# Patient Record
Sex: Female | Born: 1940 | Race: White | Hispanic: No | Marital: Married | Smoking: Never smoker
Health system: Southern US, Community
[De-identification: ages and names within clinical notes are randomized; demographics above are authoritative.]

## PROBLEM LIST (undated history)

## (undated) DIAGNOSIS — F419 Anxiety disorder, unspecified: Secondary | ICD-10-CM

## (undated) DIAGNOSIS — M1990 Unspecified osteoarthritis, unspecified site: Secondary | ICD-10-CM

## (undated) DIAGNOSIS — R112 Nausea with vomiting, unspecified: Secondary | ICD-10-CM

## (undated) DIAGNOSIS — I10 Essential (primary) hypertension: Secondary | ICD-10-CM

## (undated) DIAGNOSIS — Z9889 Other specified postprocedural states: Secondary | ICD-10-CM

## (undated) DIAGNOSIS — I499 Cardiac arrhythmia, unspecified: Secondary | ICD-10-CM

## (undated) HISTORY — PX: TONSILLECTOMY: SUR1361

## (undated) HISTORY — PX: ROTATOR CUFF REPAIR: SHX139

## (undated) HISTORY — PX: CARDIAC CATHETERIZATION: SHX172

## (undated) HISTORY — PX: ABDOMINAL HYSTERECTOMY: SHX81

---

## 1999-12-27 ENCOUNTER — Encounter: Payer: Self-pay | Admitting: Family Medicine

## 1999-12-27 ENCOUNTER — Encounter: Admission: RE | Admit: 1999-12-27 | Discharge: 1999-12-27 | Payer: Self-pay | Admitting: Family Medicine

## 2001-04-22 ENCOUNTER — Encounter: Payer: Self-pay | Admitting: Family Medicine

## 2001-04-22 ENCOUNTER — Encounter: Admission: RE | Admit: 2001-04-22 | Discharge: 2001-04-22 | Payer: Self-pay | Admitting: Family Medicine

## 2001-09-26 ENCOUNTER — Ambulatory Visit (HOSPITAL_COMMUNITY): Admission: RE | Admit: 2001-09-26 | Discharge: 2001-09-26 | Payer: Self-pay | Admitting: Orthopedic Surgery

## 2001-10-10 ENCOUNTER — Observation Stay (HOSPITAL_COMMUNITY): Admission: RE | Admit: 2001-10-10 | Discharge: 2001-10-11 | Payer: Self-pay | Admitting: Orthopedic Surgery

## 2002-10-14 ENCOUNTER — Encounter: Admission: RE | Admit: 2002-10-14 | Discharge: 2002-10-14 | Payer: Self-pay | Admitting: Family Medicine

## 2002-10-14 ENCOUNTER — Encounter: Payer: Self-pay | Admitting: Family Medicine

## 2004-05-29 ENCOUNTER — Other Ambulatory Visit: Admission: RE | Admit: 2004-05-29 | Discharge: 2004-05-29 | Payer: Self-pay | Admitting: Gynecology

## 2004-11-25 ENCOUNTER — Inpatient Hospital Stay (HOSPITAL_COMMUNITY): Admission: AD | Admit: 2004-11-25 | Discharge: 2004-11-27 | Payer: Self-pay | Admitting: Cardiology

## 2012-03-17 ENCOUNTER — Ambulatory Visit (INDEPENDENT_AMBULATORY_CARE_PROVIDER_SITE_OTHER): Payer: Medicare Other | Admitting: Surgery

## 2012-03-17 ENCOUNTER — Encounter (INDEPENDENT_AMBULATORY_CARE_PROVIDER_SITE_OTHER): Payer: Self-pay | Admitting: Surgery

## 2012-03-17 VITALS — BP 146/90 | HR 68 | Temp 97.5°F | Resp 14 | Ht 65.0 in | Wt 154.6 lb

## 2012-03-17 DIAGNOSIS — R920 Mammographic microcalcification found on diagnostic imaging of breast: Secondary | ICD-10-CM

## 2012-03-17 NOTE — Patient Instructions (Signed)
Lumpectomy, Breast Conserving Surgery A lumpectomy is breast surgery that removes only part of the breast. Another name used may be partial mastectomy. The amount removed varies. Make sure you understand how much of your breast will be removed. Reasons for a lumpectomy:  Any solid breast mass.  Grouped significant nodularity that may be confused with a solitary breast mass. Lumpectomy is the most common form of breast cancer surgery today. The surgeon removes the portion of your breast which contains the tumor (cancer). This is the lump. Some normal tissue around the lump is also removed to be sure that all the tumor has been removed.  If cancer cells are found in the margins where the breast tissue was removed, your surgeon will do more surgery to remove the remaining cancer tissue. This is called re-excision surgery. Radiation and/or chemotherapy treatments are often given following a lumpectomy to kill any cancer cells that could possibly remain.  REASONS YOU MAY NOT BE ABLE TO HAVE BREAST CONSERVING SURGERY:  The tumor is located in more than one place.  Your breast is small and the tumor is large so the breast would be disfigured.  The entire tumor removal is not successful with a lumpectomy.  You cannot commit to a full course of chemotherapy, radiation therapy or are pregnant and cannot have radiation.  You have previously had radiation to the breast to treat cancer. HOW A LUMPECTOMY IS PERFORMED If overnight nursing is not required following a biopsy, a lumpectomy can be performed as a same-day surgery. This can be done in a hospital, clinic, or surgical center. The anesthesia used will depend on your surgeon. They will discuss this with you. A general anesthetic keeps you sleeping through the procedure. LET YOUR CAREGIVERS KNOW ABOUT THE FOLLOWING:  Allergies  Medications taken including herbs, eye drops, over the counter medications, and creams.  Use of steroids (by mouth or  creams)  Previous problems with anesthetics or Novocaine.  Possibility of pregnancy, if this applies  History of blood clots (thrombophlebitis)  History of bleeding or blood problems.  Previous surgery  Other health problems BEFORE THE PROCEDURE You should be present one hour prior to your procedure unless directed otherwise.  AFTER THE PROCEDURE  After surgery, you will be taken to the recovery area where a nurse will watch and check your progress. Once you're awake, stable, and taking fluids well, barring other problems you will be allowed to go home.  Ice packs applied to your operative site may help with discomfort and keep the swelling down.  A small rubber drain may be placed in the breast for a couple of days to prevent a hematoma from developing in the breast.  A pressure dressing may be applied for 24 to 48 hours to prevent bleeding.  Keep the wound dry.  You may resume a normal diet and activities as directed. Avoid strenuous activities affecting the arm on the side of the biopsy site such as tennis, swimming, heavy lifting (more than 10 pounds) or pulling.  Bruising in the breast is normal following this procedure.  Wearing a bra - even to bed - may be more comfortable and also help keep the dressing on.  Change dressings as directed.  Only take over-the-counter or prescription medicines for pain, discomfort, or fever as directed by your caregiver. Call for your results as instructed by your surgeon. Remember it is your responsibility to get the results of your lumpectomy if your surgeon asked you to follow-up. Do not assume   everything is fine if you have not heard from your caregiver. SEEK MEDICAL CARE IF:   There is increased bleeding (more than a small spot) from the wound.  You notice redness, swelling, or increasing pain in the wound.  Pus is coming from wound.  An unexplained oral temperature above 102 F (38.9 C) develops.  You notice a foul smell  coming from the wound or dressing. SEEK IMMEDIATE MEDICAL CARE IF:   You develop a rash.  You have difficulty breathing.  You have any allergic problems. Document Released: 05/21/2006 Document Revised: 07/02/2011 Document Reviewed: 08/22/2006 Exit[employer] Patient Information 2013 [employer].  

## 2012-03-17 NOTE — Progress Notes (Signed)
Patient ID: Kristen Smith, female   DOB: Dec 18, 1940, 71 y.o.   MRN: [id number]  No chief complaint on file.   HPI Kristen Smith is a 71 y.o. female.  Patient sent at request of Dr.Cornella of [facility] due to suspicious left breast microcalcifications. Patient denies any breast mass, nipple discharge or change in either breast. No history of breast cancer. HPI  No past medical history on file.  Past Surgical History  Procedure Date  . Abdominal hysterectomy   . Rotator cuff repair     Family History  Problem Relation Age of Onset  . Alzheimer's disease Mother     Social History History  Substance Use Topics  . Smoking status: Never Smoker   . Smokeless tobacco: Not on file  . Alcohol Use: No    Allergies  Allergen Reactions  . Codeine Hives    Current Outpatient Prescriptions  Medication Sig Dispense Refill  . calcium & magnesium carbonates (MYLANTA) 311-232 MG per tablet Take 1 tablet by mouth daily.      . Celecoxib (CELEBREX PO) Take by mouth.      . Cetirizine HCl (ZYRTEC PO) Take by mouth.      . Metoprolol Succinate (TOPROL XL PO) Take 40 mg by mouth.      . Olopatadine HCl (PATADAY OP) Apply to eye.      . simvastatin (ZOCOR) 40 MG tablet Take 40 mg by mouth every evening.        Review of Systems Review of Systems  Constitutional: Negative for fever, chills and unexpected weight change.  HENT: Negative for hearing loss, congestion, sore throat, trouble swallowing and voice change.   Eyes: Negative for visual disturbance.  Respiratory: Negative for cough and wheezing.   Cardiovascular: Negative for chest pain, palpitations and leg swelling.  Gastrointestinal: Negative for nausea, vomiting, abdominal pain, diarrhea, constipation, blood in stool, abdominal distention and anal bleeding.  Genitourinary: Negative for hematuria, vaginal bleeding and difficulty urinating.  Musculoskeletal: Negative for arthralgias.  Skin: Negative for rash and wound.  Neurological:  Negative for seizures, syncope and headaches.  Hematological: Negative for adenopathy. Does not bruise/bleed easily.  Psychiatric/Behavioral: Negative for confusion.    Blood pressure 146/90, pulse 68, temperature 97.5 F (36.4 C), resp. rate 14, height 5\' 5"  (1.651 m), weight 154 lb 9.6 oz (70.126 kg).  Physical Exam Physical Exam  Constitutional: She is oriented to person, place, and time. She appears well-developed and well-nourished.  HENT:  Head: Normocephalic and atraumatic.  Eyes: EOM are normal. Pupils are equal, round, and reactive to light.  Neck: Normal range of motion. Neck supple.  Cardiovascular: Normal rate and regular rhythm.   Pulmonary/Chest: Effort normal and breath sounds normal. Right breast exhibits no inverted nipple, no mass, no nipple discharge, no skin change and no tenderness. Left breast exhibits no inverted nipple, no mass, no nipple discharge, no skin change and no tenderness. Breasts are symmetrical.    Abdominal: Soft. Bowel sounds are normal.  Musculoskeletal: Normal range of motion.  Neurological: She is alert and oriented to person, place, and time.  Skin: Skin is warm and dry.  Psychiatric: She has a normal mood and affect. Her behavior is normal. Thought content normal.    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 Risks,  benefits and alternative therapies discussed. Risk of malignancy is 10% or less with this core biopsy diagnosis. The patient like to have the area excised based on this.The procedure has been discussed with the patient. Alternatives to surgery have been discussed with the patient.  Risks of surgery include bleeding,  Infection,  Seroma formation, death,  and the need for further  surgery.   The patient understands and wishes to proceed.       Iesha Summerhill A. 03/17/2012, 12:10 PM

## 2012-04-14 ENCOUNTER — Encounter (HOSPITAL_COMMUNITY): Payer: Self-pay | Admitting: Pharmacy Technician

## 2012-04-21 ENCOUNTER — Ambulatory Visit (HOSPITAL_COMMUNITY)
Admission: RE | Admit: 2012-04-21 | Discharge: 2012-04-21 | Disposition: A | Discharge status: Home / Self Care | Payer: Medicare Other | Source: Ambulatory Visit | Attending: Surgery | Admitting: Surgery

## 2012-04-21 ENCOUNTER — Encounter (HOSPITAL_COMMUNITY): Payer: Self-pay

## 2012-04-21 ENCOUNTER — Encounter (HOSPITAL_COMMUNITY)
Admission: RE | Admit: 2012-04-21 | Discharge: 2012-04-21 | Disposition: A | Discharge status: Home / Self Care | Payer: Medicare Other | Source: Ambulatory Visit | Attending: Surgery | Admitting: Surgery

## 2012-04-21 VITALS — BP 147/84 | HR 73 | Temp 98.2°F | Resp 20 | Ht 65.0 in | Wt 158.3 lb

## 2012-04-21 DIAGNOSIS — R05 Cough: Secondary | ICD-10-CM | POA: Insufficient documentation

## 2012-04-21 DIAGNOSIS — R920 Mammographic microcalcification found on diagnostic imaging of breast: Secondary | ICD-10-CM

## 2012-04-21 DIAGNOSIS — Z01818 Encounter for other preprocedural examination: Secondary | ICD-10-CM | POA: Insufficient documentation

## 2012-04-21 DIAGNOSIS — R059 Cough, unspecified: Secondary | ICD-10-CM | POA: Insufficient documentation

## 2012-04-21 DIAGNOSIS — I517 Cardiomegaly: Secondary | ICD-10-CM | POA: Insufficient documentation

## 2012-04-21 HISTORY — DX: Unspecified osteoarthritis, unspecified site: M19.90

## 2012-04-21 HISTORY — DX: Other specified postprocedural states: R11.2

## 2012-04-21 HISTORY — DX: Other specified postprocedural states: Z98.890

## 2012-04-21 HISTORY — DX: Cardiac arrhythmia, unspecified: I49.9

## 2012-04-21 LAB — SURGICAL PCR SCREEN
MRSA, PCR: NEGATIVE
Staphylococcus aureus: NEGATIVE

## 2012-04-21 LAB — COMPREHENSIVE METABOLIC PANEL
AST: 26 U/L (ref 0–37)
Albumin: 3.9 g/dL (ref 3.5–5.2)
Alkaline Phosphatase: 96 U/L (ref 39–117)
BUN: 16 mg/dL (ref 6–23)
Chloride: 98 mEq/L (ref 96–112)
Potassium: 4.2 mEq/L (ref 3.5–5.1)
Total Bilirubin: 0.3 mg/dL (ref 0.3–1.2)

## 2012-04-21 LAB — CBC WITH DIFFERENTIAL/PLATELET
Basophils Absolute: 0 10*3/uL (ref 0.0–0.1)
Basophils Relative: 1 % (ref 0–1)
Hemoglobin: 13 g/dL (ref 12.0–15.0)
Lymphocytes Relative: 40 % (ref 12–46)
MCHC: 32.6 g/dL (ref 30.0–36.0)
Monocytes Relative: 11 % (ref 3–12)
Neutro Abs: 2.7 10*3/uL (ref 1.7–7.7)
Neutrophils Relative %: 46 % (ref 43–77)
RBC: 4.35 MIL/uL (ref 3.87–5.11)
WBC: 5.8 10*3/uL (ref 4.0–10.5)

## 2012-04-21 NOTE — Pre-Procedure Instructions (Signed)
[address]  04/21/2012   Your procedure is scheduled on:  Tues, Jan 7 @ 10:00 AM  Report to [facility] at 8:00 AM.  Call this number if you have problems the morning of surgery: [phone]   Remember:   Do not eat food:After Midnight.    Take these medicines the morning of surgery with A SIP OF WATER: Cetirizine(Zyrtec) and Metoprolol(Toprol)   Do not wear jewelry, make-up or nail polish.  Do not wear lotions, powders, or perfumes. You may wear deodorant.  Do not shave 48 hours prior to surgery.   Do not bring valuables to the hospital.  Contacts, dentures or bridgework may not be worn into surgery.  Leave suitcase in the car. After surgery it may be brought to your room.  For patients admitted to the hospital, checkout time is 11:00 AM the day of discharge.   Patients discharged the day of surgery will not be allowed to drive home.  Special Instructions: Shower using CHG 2 nights before surgery and the night before surgery.  If you shower the day of surgery use CHG.  Use special wash - you have one bottle of CHG for all showers.  You should use approximately 1/3 of the bottle for each shower.   Please read over the following fact sheets that you were given: Pain Booklet, Coughing and Deep Breathing, MRSA Information and Surgical Site Infection Prevention

## 2012-04-28 MED ORDER — DEXTROSE 5 % IV SOLN
3.0000 g | INTRAVENOUS | Status: AC
  Administered 2012-04-29: 3 g via INTRAVENOUS
  Filled 2012-04-28: qty 3000

## 2012-04-29 ENCOUNTER — Encounter (HOSPITAL_COMMUNITY): Payer: Self-pay | Admitting: Anesthesiology

## 2012-04-29 ENCOUNTER — Encounter (HOSPITAL_COMMUNITY)
Admission: RE | Disposition: A | Discharge status: Home / Self Care | Payer: Self-pay | Source: Ambulatory Visit | Attending: Surgery

## 2012-04-29 ENCOUNTER — Ambulatory Visit (HOSPITAL_COMMUNITY): Payer: Medicare Other | Admitting: Anesthesiology

## 2012-04-29 ENCOUNTER — Ambulatory Visit (HOSPITAL_COMMUNITY)
Admission: RE | Admit: 2012-04-29 | Discharge: 2012-04-29 | Disposition: A | Discharge status: Home / Self Care | Payer: Medicare Other | Source: Ambulatory Visit | Attending: Surgery | Admitting: Surgery

## 2012-04-29 DIAGNOSIS — N6089 Other benign mammary dysplasias of unspecified breast: Secondary | ICD-10-CM | POA: Insufficient documentation

## 2012-04-29 DIAGNOSIS — Z885 Allergy status to narcotic agent status: Secondary | ICD-10-CM | POA: Insufficient documentation

## 2012-04-29 DIAGNOSIS — Z90710 Acquired absence of both cervix and uterus: Secondary | ICD-10-CM | POA: Insufficient documentation

## 2012-04-29 DIAGNOSIS — N6019 Diffuse cystic mastopathy of unspecified breast: Secondary | ICD-10-CM

## 2012-04-29 DIAGNOSIS — R920 Mammographic microcalcification found on diagnostic imaging of breast: Secondary | ICD-10-CM

## 2012-04-29 HISTORY — PX: PARTIAL MASTECTOMY WITH NEEDLE LOCALIZATION: SHX6008

## 2012-04-29 SURGERY — PARTIAL MASTECTOMY WITH NEEDLE LOCALIZATION
Anesthesia: General | Site: Breast | Laterality: Left | Wound class: Clean

## 2012-04-29 MED ORDER — LACTATED RINGERS IV SOLN
INTRAVENOUS | Status: DC | PRN
  Administered 2012-04-29: 10:00:00 via INTRAVENOUS

## 2012-04-29 MED ORDER — OXYCODONE HCL 5 MG PO TABS: 5.0000 mg | ORAL_TABLET | Freq: Once | ORAL | Status: DC | PRN

## 2012-04-29 MED ORDER — DEXAMETHASONE SODIUM PHOSPHATE 4 MG/ML IJ SOLN
INTRAMUSCULAR | Status: DC | PRN
  Administered 2012-04-29: 4 mg via INTRAVENOUS

## 2012-04-29 MED ORDER — FENTANYL CITRATE 0.05 MG/ML IJ SOLN
INTRAMUSCULAR | Status: DC | PRN
  Administered 2012-04-29 (×4): 50 ug via INTRAVENOUS

## 2012-04-29 MED ORDER — CHLORHEXIDINE GLUCONATE 4 % EX LIQD: 1.0000 "application " | Freq: Once | CUTANEOUS | Status: DC

## 2012-04-29 MED ORDER — OXYCODONE HCL 5 MG PO TABS
5.0000 mg | ORAL_TABLET | Freq: Once | ORAL | Status: DC | PRN
Start: 2012-04-29 — End: 2012-04-29

## 2012-04-29 MED ORDER — LIDOCAINE HCL (CARDIAC) 20 MG/ML IV SOLN
INTRAVENOUS | Status: DC | PRN
  Administered 2012-04-29: 50 mg via INTRAVENOUS

## 2012-04-29 MED ORDER — HYDROMORPHONE HCL PF 1 MG/ML IJ SOLN: 0.2500 mg | INTRAMUSCULAR | Status: DC | PRN

## 2012-04-29 MED ORDER — OXYCODONE HCL 5 MG/5ML PO SOLN: 5.0000 mg | Freq: Once | ORAL | Status: DC | PRN

## 2012-04-29 MED ORDER — BUPIVACAINE-EPINEPHRINE 0.25% -1:200000 IJ SOLN
INTRAMUSCULAR | Status: DC | PRN
  Administered 2012-04-29: 20 mL

## 2012-04-29 MED ORDER — MEPERIDINE HCL 25 MG/ML IJ SOLN: 6.2500 mg | INTRAMUSCULAR | Status: DC | PRN

## 2012-04-29 MED ORDER — TRAMADOL HCL 50 MG PO TABS: 50.0000 mg | ORAL_TABLET | Freq: Four times a day (QID) | ORAL | Status: DC | PRN

## 2012-04-29 MED ORDER — ARTIFICIAL TEARS OP OINT
TOPICAL_OINTMENT | OPHTHALMIC | Status: DC | PRN
  Administered 2012-04-29: 1 via OPHTHALMIC

## 2012-04-29 MED ORDER — ONDANSETRON HCL 4 MG/2ML IJ SOLN: 4.0000 mg | Freq: Once | INTRAMUSCULAR | Status: DC | PRN

## 2012-04-29 MED ORDER — BUPIVACAINE-EPINEPHRINE 0.25% -1:200000 IJ SOLN
INTRAMUSCULAR | Status: AC
  Filled 2012-04-29: qty 1

## 2012-04-29 MED ORDER — MIDAZOLAM HCL 5 MG/5ML IJ SOLN
INTRAMUSCULAR | Status: DC | PRN
  Administered 2012-04-29: 2 mg via INTRAVENOUS

## 2012-04-29 MED ORDER — PROPOFOL 10 MG/ML IV BOLUS
INTRAVENOUS | Status: DC | PRN
  Administered 2012-04-29: 150 mg via INTRAVENOUS

## 2012-04-29 SURGICAL SUPPLY — 46 items
ADH SKN CLS APL DERMABOND .7 (GAUZE/BANDAGES/DRESSINGS) ×1
APPLIER CLIP 9.375 MED OPEN (MISCELLANEOUS)
APR CLP MED 9.3 20 MLT OPN (MISCELLANEOUS)
BINDER BREAST LRG (GAUZE/BANDAGES/DRESSINGS) IMPLANT
BINDER BREAST XLRG (GAUZE/BANDAGES/DRESSINGS) IMPLANT
BLADE SURG 10 STRL SS (BLADE) ×2 IMPLANT
BLADE SURG 15 STRL LF DISP TIS (BLADE) ×1 IMPLANT
BLADE SURG 15 STRL SS (BLADE) ×2
CANISTER SUCTION 2500CC (MISCELLANEOUS) ×1 IMPLANT
CHLORAPREP W/TINT 26ML (MISCELLANEOUS) ×2 IMPLANT
CLIP APPLIE 9.375 MED OPEN (MISCELLANEOUS) IMPLANT
CLOTH BEACON ORANGE TIMEOUT ST (SAFETY) ×2 IMPLANT
COVER SURGICAL LIGHT HANDLE (MISCELLANEOUS) ×2 IMPLANT
DERMABOND ADVANCED (GAUZE/BANDAGES/DRESSINGS) ×1
DERMABOND ADVANCED .7 DNX12 (GAUZE/BANDAGES/DRESSINGS) ×1 IMPLANT
DEVICE DUBIN SPECIMEN MAMMOGRA (MISCELLANEOUS) ×2 IMPLANT
DRAPE CHEST BREAST 15X10 FENES (DRAPES) ×2 IMPLANT
ELECT CAUTERY BLADE 6.4 (BLADE) ×2 IMPLANT
ELECT REM PT RETURN 9FT ADLT (ELECTROSURGICAL) ×2
ELECTRODE REM PT RTRN 9FT ADLT (ELECTROSURGICAL) ×1 IMPLANT
GLOVE BIO SURGEON STRL SZ7 (GLOVE) ×2 IMPLANT
GLOVE BIOGEL PI IND STRL 7.5 (GLOVE) ×1 IMPLANT
GLOVE BIOGEL PI INDICATOR 7.5 (GLOVE) ×1
GOWN STRL NON-REIN LRG LVL3 (GOWN DISPOSABLE) ×4 IMPLANT
KIT BASIN OR (CUSTOM PROCEDURE TRAY) ×2 IMPLANT
KIT MARKER MARGIN INK (KITS) IMPLANT
KIT ROOM TURNOVER OR (KITS) ×2 IMPLANT
NS IRRIG 1000ML POUR BTL (IV SOLUTION) ×2 IMPLANT
PACK SURGICAL SETUP 50X90 (CUSTOM PROCEDURE TRAY) ×2 IMPLANT
PAD ARMBOARD 7.5X6 YLW CONV (MISCELLANEOUS) ×2 IMPLANT
PENCIL BUTTON HOLSTER BLD 10FT (ELECTRODE) ×2 IMPLANT
SPONGE GAUZE 4X4 12PLY (GAUZE/BANDAGES/DRESSINGS) IMPLANT
SPONGE LAP 18X18 X RAY DECT (DISPOSABLE) ×2 IMPLANT
STAPLER VISISTAT 35W (STAPLE) ×2 IMPLANT
SUT MNCRL AB 4-0 PS2 18 (SUTURE) ×1 IMPLANT
SUT SILK 2 0 SH (SUTURE) IMPLANT
SUT VIC AB 2-0 SH 27 (SUTURE) ×2
SUT VIC AB 2-0 SH 27XBRD (SUTURE) ×1 IMPLANT
SUT VIC AB 3-0 SH 27 (SUTURE) ×2
SUT VIC AB 3-0 SH 27X BRD (SUTURE) ×1 IMPLANT
SYR BULB 3OZ (MISCELLANEOUS) ×2 IMPLANT
SYR CONTROL 10ML LL (SYRINGE) ×2 IMPLANT
TOWEL OR 17X24 6PK STRL BLUE (TOWEL DISPOSABLE) ×2 IMPLANT
TOWEL OR 17X26 10 PK STRL BLUE (TOWEL DISPOSABLE) ×2 IMPLANT
TUBE CONNECTING 12X1/4 (SUCTIONS) IMPLANT
YANKAUER SUCT BULB TIP NO VENT (SUCTIONS) IMPLANT

## 2012-04-29 NOTE — H&P (Signed)
Demographics Kristen Smith 72 year old female  Comm Pref: None [address]  [place] [address] [phone] (M)    Problem ListNone  Significant History/Details  Smoking: Never Smoker   Smokeless Tobacco: Unknown  Alcohol: No  1 open order  Language: English   Specialty CommentsEditShow AllReport11/25/13 Pt signed PHI for Diarra Ceja ([birth year]) and Velora Mediate ([birth year])bnr DOS 04/29/12 [facility]-OP-Lt Br Partial Masty w/NL(8am @ [facility]) 03/18/12 dm 04/24/2012 patient scheduled for op surgery 04/29/2012 @ [facility] no precert required per Vernell Barrier [phone]. (dm,chm)   MedicationsLong-TermPrescriptions Show Facility-Administered Medications    Biotin 1000 MCG tablet    celecoxib (CELEBREX) 200 MG capsule    cetirizine (ZYRTEC) 10 MG tablet    cholecalciferol (VITAMIN D) 1000 UNITS tablet    Coenzyme Q10 200 MG TABS    magnesium oxide (MAG-OX) 400 (241.3 MG) MG tablet    metoprolol succinate (TOPROL-XL) 50 MG 24 hr tablet    Misc Natural Products (TART CHERRY ADVANCED) CAPS    Nutritional Supplements (JUICE PLUS FIBRE PO)    Nutritional Supplements (JUICE PLUS FIBRE PO)    Olopatadine HCl (PATADAY) 0.2 % SOLN    Omega-3 300 MG CAPS   simvastatin (ZOCOR) 40 MG tablet    Specialty Vitamins Products (ONE-A-DAY BONE STRENGTH) 500-28-100 MG-MG-UNIT TABS    vitamin C (ASCORBIC ACID) 500 MG tablet     Relevant Labs (3 years)  Na K Cl C02 WBC Hgb Hct Plts  04/21/12 1345 -- -- -- -- 5.8 13.0 39.9 264  04/21/12 1345 137 4.2 98 -- -- -- -- --                  Relevant Encounters (Maximum of 10 visits)Date Type Department Provider Description  04/29/2012 Surgery [facility] OPERATING ROOM Dortha Schwalbe., MD   03/17/2012 Office Visit [facility] Dortha Schwalbe., MD Breast Microcalcifications (Primary Dx)          My Last Outpatient Progress NoteStatus Last Edited Encounter Date   Patient ID: Kristen Smith, female   DOB:  1940/06/27, 72 y.o.   MRN: [id number]   No chief complaint on file.   HPI Kristen Smith is a 72 y.o. female.  Patient sent at request of Dr.Cornella of [facility] due to suspicious left breast microcalcifications. Patient denies any breast mass, nipple discharge or change in either breast. No history of breast cancer. HPI   No past medical history on file.    Past Surgical History   Procedure  Date   .  Abdominal hysterectomy     .  Rotator cuff repair         Family History   Problem  Relation  Age of Onset   .  Alzheimer's disease  Mother        Social History History   Substance Use Topics   .  Smoking status:  Never Smoker    .  Smokeless tobacco:  Not on file   .  Alcohol Use:  No       Allergies   Allergen  Reactions   .  Codeine  Hives       Current Outpatient Prescriptions   Medication  Sig  Dispense  Refill   .  calcium & magnesium carbonates (MYLANTA) 311-232 MG per tablet  Take 1 tablet by mouth daily.         .  Celecoxib (CELEBREX PO)  Take by mouth.         Marland Kitchen  Cetirizine HCl (ZYRTEC PO)  Take by mouth.         .  Metoprolol Succinate (TOPROL XL PO)  Take 40 mg by mouth.         .  Olopatadine HCl (PATADAY OP)  Apply to eye.         .  simvastatin (ZOCOR) 40 MG tablet  Take 40 mg by mouth every evening.            Review of Systems Review of Systems  Constitutional: Negative for fever, chills and unexpected weight change.  HENT: Negative for hearing loss, congestion, sore throat, trouble swallowing and voice change.   Eyes: Negative for visual disturbance.  Respiratory: Negative for cough and wheezing.   Cardiovascular: Negative for chest pain, palpitations and leg swelling.  Gastrointestinal: Negative for nausea, vomiting, abdominal pain, diarrhea, constipation, blood in stool, abdominal distention and anal bleeding.  Genitourinary: Negative for hematuria, vaginal bleeding and difficulty urinating.  Musculoskeletal: Negative for arthralgias.  Skin:  Negative for rash and wound.  Neurological: Negative for seizures, syncope and headaches.  Hematological: Negative for adenopathy. Does not bruise/bleed easily.  Psychiatric/Behavioral: Negative for confusion.    Blood pressure 146/90, pulse 68, temperature 97.5 F (36.4 C), resp. rate 14, height 5\' 5"  (1.651 m), weight 154 lb 9.6 oz (70.126 kg).   Physical Exam Physical Exam  Constitutional: She is oriented to person, place, and time. She appears well-developed and well-nourished.  HENT:   Head: Normocephalic and atraumatic.  Eyes: EOM are normal. Pupils are equal, round, and reactive to light.  Neck: Normal range of motion. Neck supple.  Cardiovascular: Normal rate and regular rhythm.   Pulmonary/Chest: Effort normal and breath sounds normal. Right breast exhibits no inverted nipple, no mass, no nipple discharge, no skin change and no tenderness. Left breast exhibits no inverted nipple, no mass, no nipple discharge, no skin change and no tenderness. Breasts are symmetrical.    Abdominal: Soft. Bowel sounds are normal.  Musculoskeletal: Normal range of motion.  Neurological: She is alert and oriented to person, place, and time.  Skin: Skin is warm and dry.  Psychiatric: She has a normal mood and affect. Her behavior is normal. Thought content normal.    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 Risks, benefits and alternative therapies discussed. Risk of malignancy is 10% or less with this core biopsy diagnosis. The patient like to have the area excised based on this.The procedure has been discussed with the patient. Alternatives to surgery have been discussed with the patient.  Risks of surgery include bleeding,  Infection,  Seroma  formation, death,  and the need for further surgery.   The patient understands and wishes to proceed.       Kristen Smith A. 04/29/2012

## 2012-04-29 NOTE — Anesthesia Procedure Notes (Signed)
Procedure Name: LMA Insertion Date/Time: 04/29/2012 10:35 AM Performed by: Elizbeth Squires R Pre-anesthesia Checklist: Patient identified, Emergency Drugs available, Suction available and Patient being monitored Patient Re-evaluated:Patient Re-evaluated prior to inductionOxygen Delivery Method: Circle system utilized Preoxygenation: Pre-oxygenation with 100% oxygen Intubation Type: IV induction LMA: LMA inserted LMA Size: 4.0 Number of attempts: 1 Placement Confirmation: positive ETCO2 and breath sounds checked- equal and bilateral Tube secured with: Tape Dental Injury: Teeth and Oropharynx as per pre-operative assessment

## 2012-04-29 NOTE — Anesthesia Postprocedure Evaluation (Signed)
Anesthesia Post Note  Patient: Kristen Smith  Procedure(s) Performed: Procedure(s) (LRB): PARTIAL MASTECTOMY WITH NEEDLE LOCALIZATION (Left)  Anesthesia type: general  Patient location: PACU  Post pain: Pain level controlled  Post assessment: Patient's Cardiovascular Status Stable  Last Vitals:  Filed Vitals:   04/29/12 1207  BP: 140/71  Pulse: 85  Temp:   Resp:     Post vital signs: Reviewed and stable  Level of consciousness: sedated  Complications: No apparent anesthesia complications

## 2012-04-29 NOTE — Interval H&P Note (Signed)
History and Physical Interval Note:  04/29/2012 9:52 AM  Kristen Smith  has presented today for surgery, with the diagnosis of left breast microcalciification  The various methods of treatment have been discussed with the patient and family. After consideration of risks, benefits and other options for treatment, the patient has consented to  Procedure(s) (LRB) with comments: PARTIAL MASTECTOMY WITH NEEDLE LOCALIZATION (Left) - left breast partial mastectomy with needle localization as a surgical intervention .  The patient's history has been reviewed, patient examined, no change in status, stable for surgery.  I have reviewed the patient's chart and labs.  Questions were answered to the patient's satisfaction.     Kristjan Derner A.

## 2012-04-29 NOTE — Op Note (Signed)
Preoperative diagnosis: Left breast microcalcifications  Postop diagnosis: Same  Procedure: left  Partial mastectomy with wire localization  Surgeon: Harriette Bouillon M.D.  Anesthesia: LMA with 0.25% Sensorcaine local  EBL: Less than 40 cc  Specimen:  Left Breast mass with wire and clip verified by radiography to pathology  Drains: None  Indications for procedure: The patient presents with a breast mass/  microcalcifications left breast felt to be suspicious. 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 Marland Kitchen The patient was to proceed with partial mastectomy with wire localization. The procedure has been discussed with the patient. Alternatives to surgery have been discussed with the patient.  Risks of surgery include bleeding,  Infection,  Seroma formation, death,  and the need for further surgery.   The patient understands and wishes to proceed.  Description of procedure: The patient was seen in the holding area and the appropriate side was marked. Questions are answered. Wire localization was done the radiology. The patient was taken back to the operating room and placed supine on the operating room table. After induction of general anesthesia, chest and upper arm on the left  were prepped and draped in a sterile fashion. Timeout was done and she received preoperative antibiotics. Curvilinear incision was made around the wire insertion site in the outer upper quadrant. All tissue around the wire was excised and hemostasis was achieved with cautery. The area was removed in its entirety upon gross examination. Gross margin negative. Radiograph revealed the mass, wire and clip to be in the specimen. The wound was closed in layers using 3-0 Vicryl and 4-0 Monocryl subcuticular stitch. Dermabond applied. All final counts found to be correct. Patient awoke extubated taken recovery in satisfactory condition.

## 2012-04-29 NOTE — Anesthesia Preprocedure Evaluation (Signed)
Anesthesia Evaluation  Patient identified by MRN, date of birth, ID band Patient awake    Reviewed: Allergy & Precautions, H&P , NPO status , Patient's Chart, lab work & pertinent test results  History of Anesthesia Complications (+) PONV  Airway Mallampati: I TM Distance: >3 FB Neck ROM: Full    Dental   Pulmonary          Cardiovascular     Neuro/Psych    GI/Hepatic   Endo/Other    Renal/GU      Musculoskeletal   Abdominal   Peds  Hematology   Anesthesia Other Findings   Reproductive/Obstetrics                           Anesthesia Physical Anesthesia Plan  ASA: II  Anesthesia Plan: General   Post-op Pain Management:    Induction: Intravenous  Airway Management Planned: LMA  Additional Equipment:   Intra-op Plan:   Post-operative Plan: Extubation in OR  Informed Consent: I have reviewed the patients History and Physical, chart, labs and discussed the procedure including the risks, benefits and alternatives for the proposed anesthesia with the patient or authorized representative who has indicated his/her understanding and acceptance.     Plan Discussed with: CRNA and Surgeon  Anesthesia Plan Comments:         Anesthesia Quick Evaluation  

## 2012-04-29 NOTE — Interval H&P Note (Signed)
History and Physical Interval Note:  04/29/2012 9:52 AM  Kristen Smith  has presented today for surgery, with the diagnosis of left breast microcalciification  The various methods of treatment have been discussed with the patient and family. After consideration of risks, benefits and other options for treatment, the patient has consented to  Procedure(s) (LRB) with comments: PARTIAL MASTECTOMY WITH NEEDLE LOCALIZATION (Left) - left breast partial mastectomy with needle localization as a surgical intervention .  The patient's history has been reviewed, patient examined, no change in status, stable for surgery.  I have reviewed the patient's chart and labs.  Questions were answered to the patient's satisfaction.     Damien Cisar A.   

## 2012-04-29 NOTE — Transfer of Care (Signed)
Immediate Anesthesia Transfer of Care Note  Patient: Kristen Smith  Procedure(s) Performed: Procedure(s) (LRB) with comments: PARTIAL MASTECTOMY WITH NEEDLE LOCALIZATION (Left) - left breast partial mastectomy with needle localization  Patient Location: PACU  Anesthesia Type:General  Level of Consciousness: sedated  Airway & Oxygen Therapy: Patient Spontanous Breathing and Patient connected to nasal cannula oxygen  Post-op Assessment: Report given to PACU RN and Post -op Vital signs reviewed and stable  Post vital signs: Reviewed and stable  Complications: No apparent anesthesia complications

## 2012-04-30 ENCOUNTER — Encounter (HOSPITAL_COMMUNITY): Payer: Self-pay | Admitting: Surgery

## 2012-05-01 ENCOUNTER — Telehealth (INDEPENDENT_AMBULATORY_CARE_PROVIDER_SITE_OTHER): Payer: Self-pay

## 2012-05-01 NOTE — Telephone Encounter (Signed)
I called pt and gave her benign path results. Patient will follow up at the end of the month.

## 2012-05-13 ENCOUNTER — Encounter (INDEPENDENT_AMBULATORY_CARE_PROVIDER_SITE_OTHER): Payer: Self-pay

## 2012-05-19 ENCOUNTER — Encounter (INDEPENDENT_AMBULATORY_CARE_PROVIDER_SITE_OTHER): Payer: Medicare Other | Admitting: Surgery

## 2012-05-23 ENCOUNTER — Encounter (INDEPENDENT_AMBULATORY_CARE_PROVIDER_SITE_OTHER): Payer: Medicare Other | Admitting: Surgery

## 2012-05-30 ENCOUNTER — Ambulatory Visit (INDEPENDENT_AMBULATORY_CARE_PROVIDER_SITE_OTHER): Payer: Medicare Other | Admitting: Surgery

## 2012-05-30 ENCOUNTER — Encounter (INDEPENDENT_AMBULATORY_CARE_PROVIDER_SITE_OTHER): Payer: Self-pay | Admitting: Surgery

## 2012-05-30 VITALS — BP 140/88 | HR 72 | Temp 97.9°F | Resp 14 | Ht 65.0 in | Wt 156.6 lb

## 2012-05-30 DIAGNOSIS — Z9889 Other specified postprocedural states: Secondary | ICD-10-CM

## 2012-05-30 NOTE — Patient Instructions (Signed)
Return as needed

## 2012-05-30 NOTE — Progress Notes (Signed)
Patient returns at the left breast partial mastectomy. Final pathology showed fibrocystic changes.  Exam: Left breast incision clean dry and intact. No signs of infection. No significant cervical.  Impression: Status post left breast partial mastectomy for atypical microcalcifications and mass with diagnosis of fibrocystic change  Plan: Resume routine mammography.return as needed

## 2012-06-09 ENCOUNTER — Encounter (INDEPENDENT_AMBULATORY_CARE_PROVIDER_SITE_OTHER): Payer: Self-pay

## 2014-01-01 ENCOUNTER — Encounter: Payer: Self-pay | Admitting: Podiatrist

## 2015-06-13 ENCOUNTER — Other Ambulatory Visit: Payer: Self-pay

## 2016-02-15 DIAGNOSIS — G2581 Restless legs syndrome: Secondary | ICD-10-CM | POA: Insufficient documentation

## 2016-02-15 DIAGNOSIS — J329 Chronic sinusitis, unspecified: Secondary | ICD-10-CM | POA: Insufficient documentation

## 2016-02-15 DIAGNOSIS — F5101 Primary insomnia: Secondary | ICD-10-CM | POA: Insufficient documentation

## 2016-02-15 DIAGNOSIS — R5381 Other malaise: Secondary | ICD-10-CM | POA: Insufficient documentation

## 2016-02-15 DIAGNOSIS — M810 Age-related osteoporosis without current pathological fracture: Secondary | ICD-10-CM | POA: Insufficient documentation

## 2016-02-15 DIAGNOSIS — M150 Primary generalized (osteo)arthritis: Secondary | ICD-10-CM | POA: Insufficient documentation

## 2016-02-16 DIAGNOSIS — F419 Anxiety disorder, unspecified: Secondary | ICD-10-CM | POA: Insufficient documentation

## 2016-02-16 DIAGNOSIS — K219 Gastro-esophageal reflux disease without esophagitis: Secondary | ICD-10-CM | POA: Insufficient documentation

## 2018-02-04 DIAGNOSIS — G8929 Other chronic pain: Secondary | ICD-10-CM | POA: Insufficient documentation

## 2019-11-30 DIAGNOSIS — R413 Other amnesia: Secondary | ICD-10-CM | POA: Insufficient documentation

## 2019-12-03 ENCOUNTER — Other Ambulatory Visit: Payer: Self-pay | Admitting: Specialist

## 2019-12-03 DIAGNOSIS — R413 Other amnesia: Secondary | ICD-10-CM

## 2019-12-03 DIAGNOSIS — E782 Mixed hyperlipidemia: Secondary | ICD-10-CM

## 2019-12-10 ENCOUNTER — Other Ambulatory Visit: Payer: Self-pay

## 2020-01-04 ENCOUNTER — Other Ambulatory Visit: Payer: Self-pay

## 2020-01-18 ENCOUNTER — Ambulatory Visit
Admission: RE | Admit: 2020-01-18 | Discharge: 2020-01-18 | Disposition: A | Discharge status: Home / Self Care | Payer: Medicare HMO | Source: Ambulatory Visit | Attending: Specialist | Admitting: Specialist

## 2020-01-18 DIAGNOSIS — R413 Other amnesia: Secondary | ICD-10-CM

## 2020-01-18 DIAGNOSIS — E782 Mixed hyperlipidemia: Secondary | ICD-10-CM

## 2020-01-18 MED ORDER — GADOBENATE DIMEGLUMINE 529 MG/ML IV SOLN
14.0000 mL | Freq: Once | INTRAVENOUS | Status: AC | PRN
  Administered 2020-01-18: 14 mL via INTRAVENOUS

## 2020-09-08 DIAGNOSIS — M25552 Pain in left hip: Secondary | ICD-10-CM | POA: Insufficient documentation

## 2020-09-08 DIAGNOSIS — M545 Low back pain, unspecified: Secondary | ICD-10-CM | POA: Insufficient documentation

## 2021-01-07 IMAGING — US US CAROTID DUPLEX BILAT
1 series · 13 of 24 positions shown · non-contrast
Comparison: None.

CLINICAL DATA: 79-year-old female with short-term memory loss

EXAM:
BILATERAL CAROTID DUPLEX ULTRASOUND
TECHNIQUE: Gray scale imaging, color Doppler and duplex ultrasound were
performed of bilateral carotid and vertebral arteries in the neck.

[Series 1: us carotid duplex bilat · 0.06mm/px · 13 of 61 slices shown]
[im 1/61]
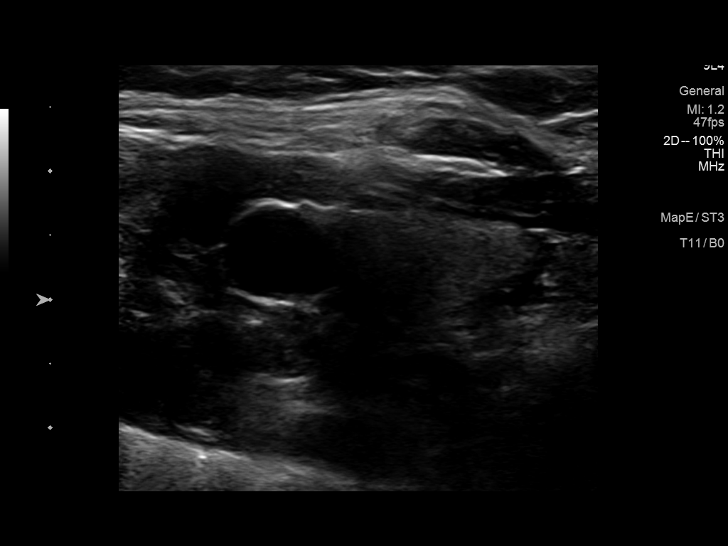
[im 6/61]
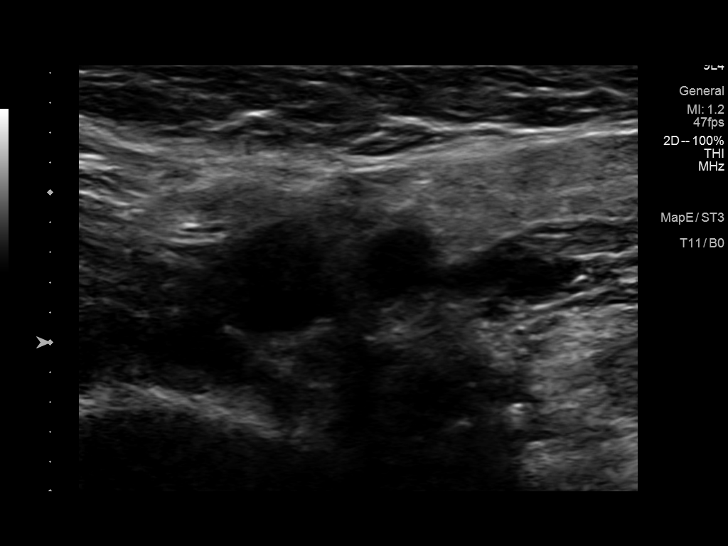
[im 11/61]
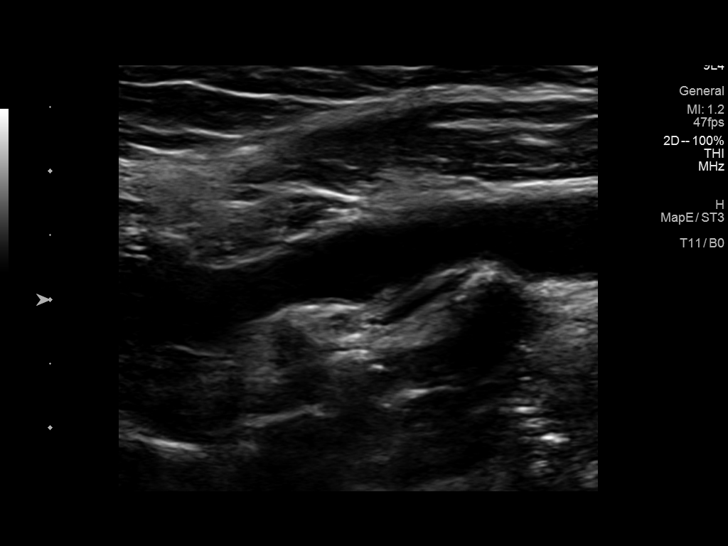
[im 16/61]
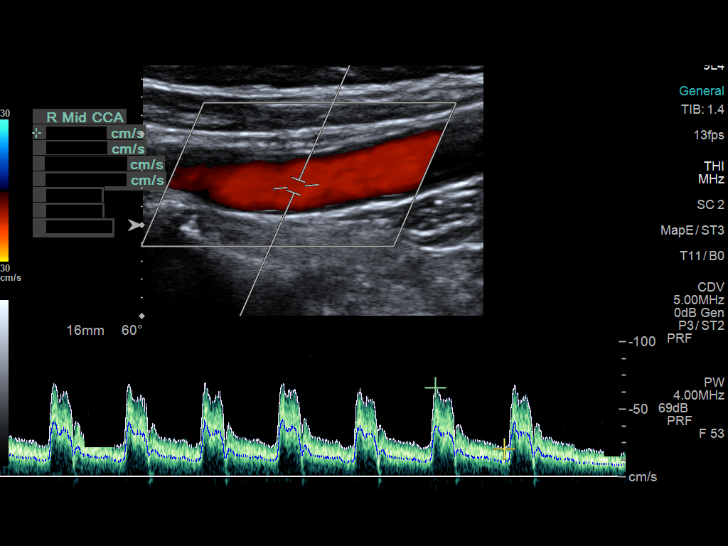
[im 21/61]
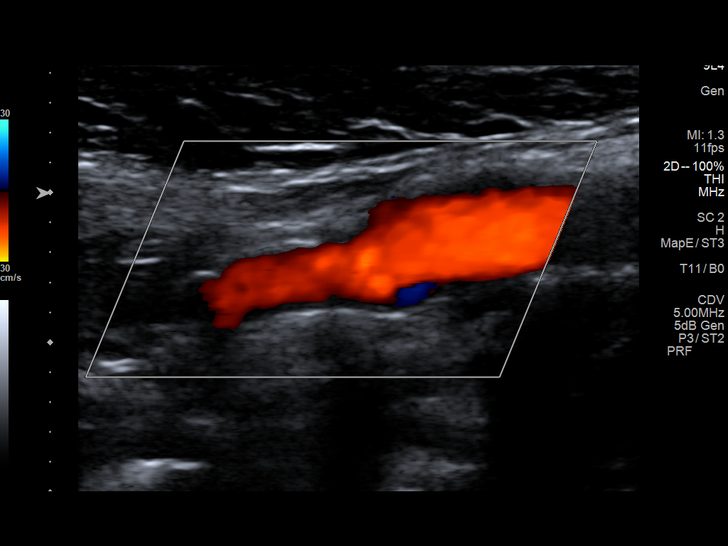
[im 27/61]
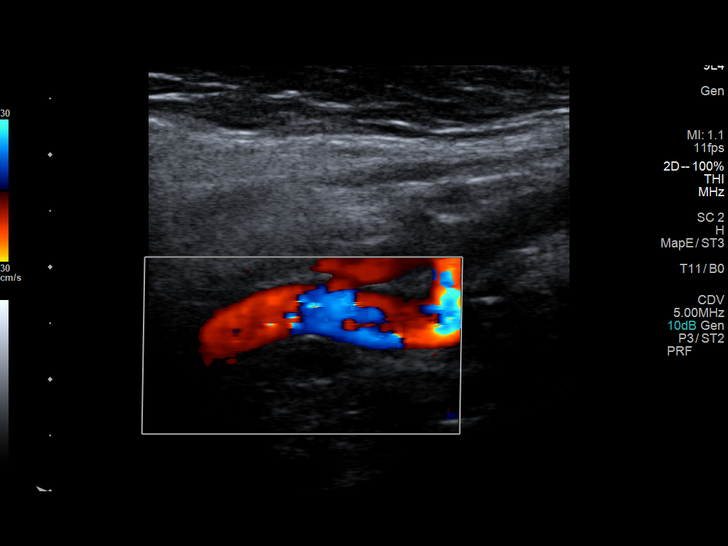
[im 32/61]
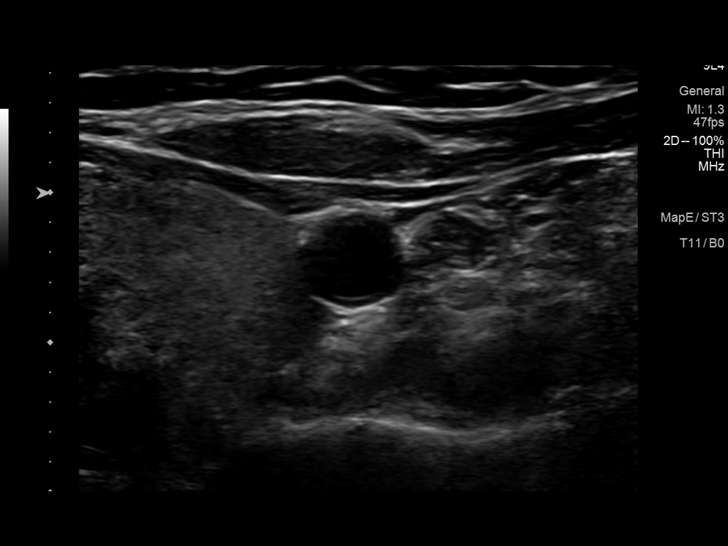
[im 34/61]
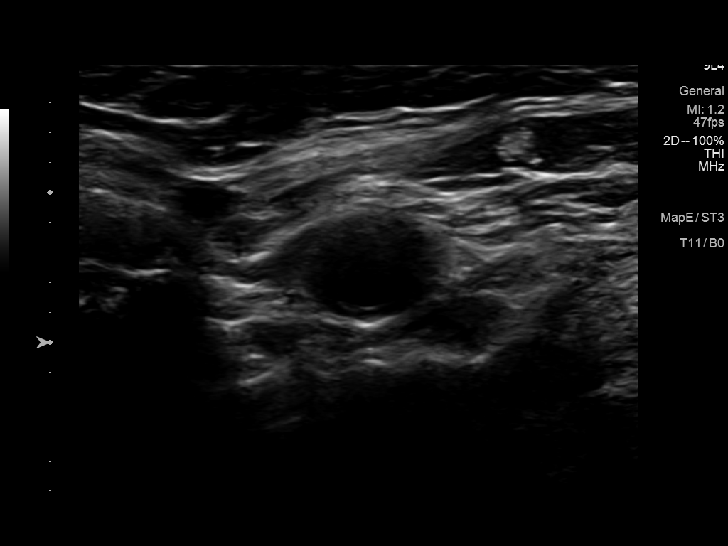
[im 40/61]
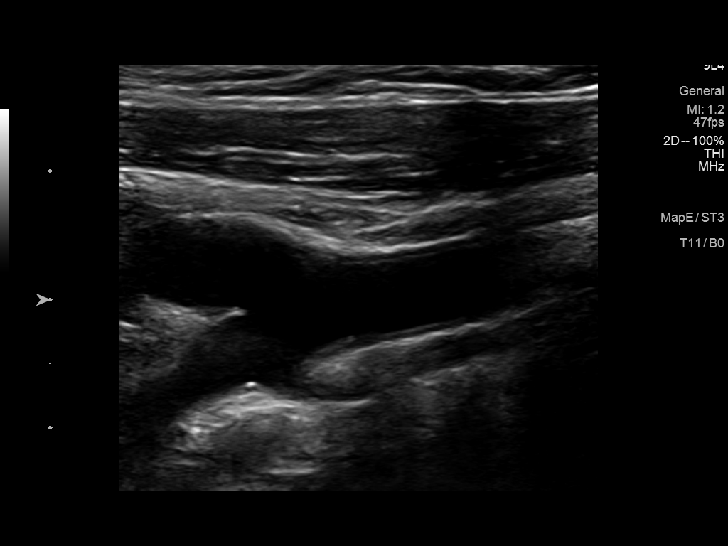
[im 45/61]
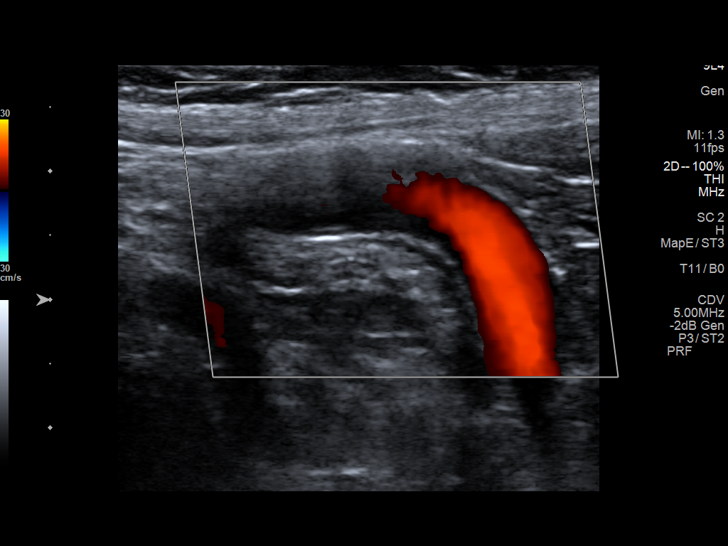
[im 50/61]
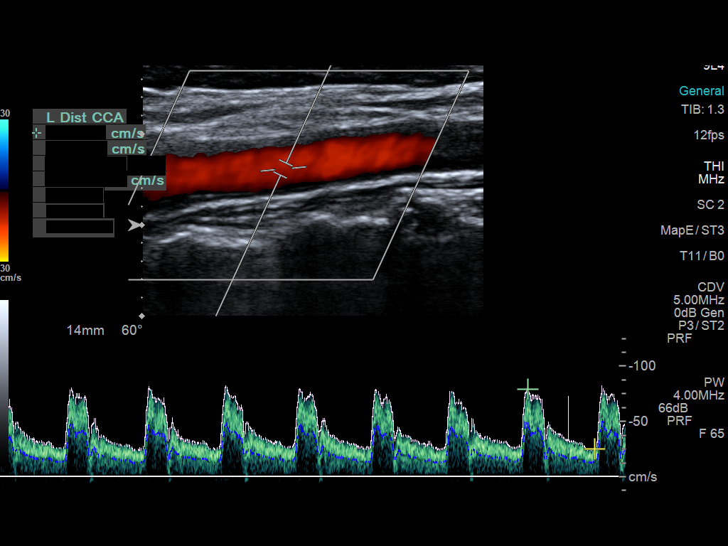
[im 55/61]
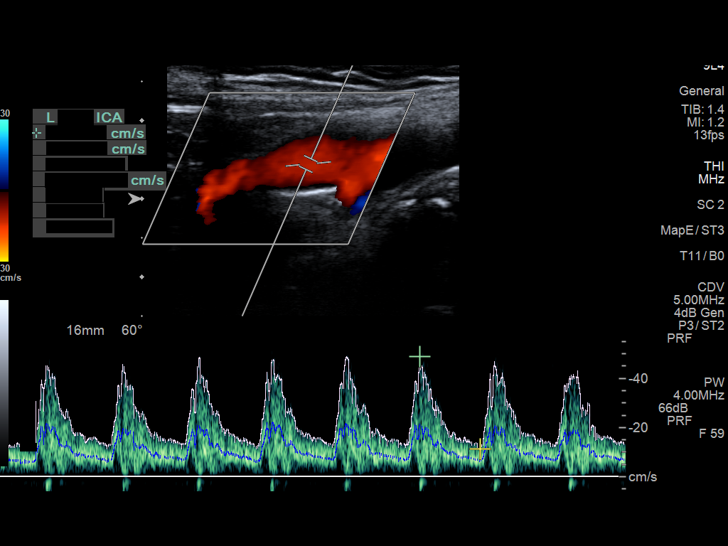
[im 61/61]
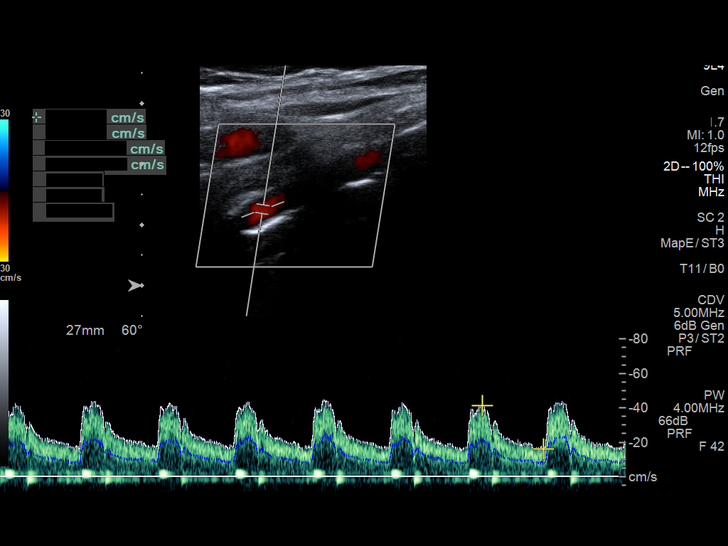

[13 of 24 positions shown; findings below may reference images not displayed]

FINDINGS: Criteria: Quantification of carotid stenosis is based on velocity
parameters that correlate the residual internal carotid diameter
with NASCET-based stenosis levels, using the diameter of the distal
internal carotid lumen as the denominator for stenosis measurement.

The following velocity measurements were obtained:

RIGHT

ICA:  Systolic 102 cm/sec, Diastolic 41 cm/sec

CCA:  69 cm/sec

SYSTOLIC ICA/CCA RATIO:

ECA:  47 cm/sec

LEFT

ICA:  Systolic 81 cm/sec, Diastolic 33 cm/sec

CCA:  93 cm/sec

SYSTOLIC ICA/CCA RATIO:

ECA:  49 cm/sec

Right Brachial SBP: 136

Left Brachial SBP: 140

RIGHT CAROTID ARTERY: No significant calcifications of the right
common carotid artery. Intermediate waveform maintained.
Heterogeneous and partially calcified plaque at the right carotid
bifurcation. No significant lumen shadowing. Low resistance waveform
of the right ICA. No significant tortuosity.

RIGHT VERTEBRAL ARTERY: Antegrade flow with low resistance waveform.

LEFT CAROTID ARTERY: No significant calcifications of the left
common carotid artery. Intermediate waveform maintained.
Heterogeneous and partially calcified plaque at the left carotid
bifurcation without significant lumen shadowing. Low resistance
waveform of the left ICA. No significant tortuosity.

LEFT VERTEBRAL ARTERY:  Antegrade flow with low resistance waveform.
IMPRESSION: Color duplex indicates minimal heterogeneous and calcified plaque,
with no hemodynamically significant stenosis by duplex criteria in
the extracranial cerebrovascular circulation.

## 2021-08-02 DIAGNOSIS — E871 Hypo-osmolality and hyponatremia: Secondary | ICD-10-CM | POA: Insufficient documentation

## 2021-08-30 ENCOUNTER — Telehealth: Payer: Self-pay | Admitting: *Deleted

## 2021-08-30 NOTE — Telephone Encounter (Signed)
? ?  Pre-operative Risk Assessment  ?  ?Patient Name: Kristen Smith  ?DOB: July 29, 1940 ?MRN: [id number]  ? ?  ? ?Request for Surgical Clearance   ? ?Procedure:   LEFT TOTAL ARTHROPLASTY ? ?Date of Surgery:  Clearance 09/19/21                              ?   ?Surgeon:  dr Paralee Cancel ?Surgeon's Group or Practice Name:  [facility] ?Phone number:  [phone] ?Fax number:  [phone] ?  ?Type of Clearance Requested:   ?- Medical  ?  ?Type of Anesthesia:  Spinal ?  ?Additional requests/questions:  Please advise surgeon/provider what medications should be held. ? ?Signed, ?Trixie Dredge V   ?08/30/2021, 6:01 PM   ?

## 2021-08-31 NOTE — Telephone Encounter (Signed)
Pt has appt 09/05/21 with Dr. Allyson Sabal. Will send FYI to requesting office pt has appt. Once MD has cleared the pt he will have his nurse/cma fax over his ov notes giving clearance and any medications recommendations.  ?

## 2021-08-31 NOTE — Telephone Encounter (Signed)
Primary Cardiologist:None ? ?Chart reviewed as part of pre-operative protocol coverage. Because of Kristen Smith past medical history and time since last visit, he/she will require a follow-up visit in order to better assess preoperative cardiovascular risk. ? ?Pre-op covering staff: ?- Please schedule appointment and call patient to inform them. ?- Please contact requesting surgeon's office via preferred method (i.e, phone, fax) to inform them of need for appointment prior to surgery. ? ?If applicable, this message will also be routed to pharmacy pool and/or primary cardiologist for input on holding anticoagulant/antiplatelet agent as requested below so that this information is available at time of patient's appointment.  ? ?Kristen Asters, NP  ?08/31/2021, 11:13 AM  ? ?

## 2021-09-05 ENCOUNTER — Encounter: Payer: Self-pay | Admitting: Cardiovascular Disease

## 2021-09-05 ENCOUNTER — Ambulatory Visit (INDEPENDENT_AMBULATORY_CARE_PROVIDER_SITE_OTHER): Payer: Medicare HMO | Admitting: Cardiovascular Disease

## 2021-09-05 DIAGNOSIS — E782 Mixed hyperlipidemia: Secondary | ICD-10-CM

## 2021-09-05 DIAGNOSIS — I10 Essential (primary) hypertension: Secondary | ICD-10-CM | POA: Insufficient documentation

## 2021-09-05 DIAGNOSIS — Z01810 Encounter for preprocedural cardiovascular examination: Secondary | ICD-10-CM

## 2021-09-05 DIAGNOSIS — Z01818 Encounter for other preprocedural examination: Secondary | ICD-10-CM | POA: Insufficient documentation

## 2021-09-05 DIAGNOSIS — E785 Hyperlipidemia, unspecified: Secondary | ICD-10-CM | POA: Insufficient documentation

## 2021-09-05 NOTE — Progress Notes (Signed)
? ? ? ?09/05/2021 ?Kristen Smith   ?May 17, 1940  ?[id number] ? ?Primary Physician Kristen Smith ?Primary Cardiologist: Kristen Gess Smith Kristen Smith, [place] ? ?HPI:  Kristen Smith is a 81 y.o. thin-appearing married Caucasian female mother of 1 daughter Kristen Smith's husband Kristen Smith is a patient of mine as well.  She was referred to me by her PCP, Dr. Shary Smith , for preoperative clearance before left total knee replacement scheduled on 09/19/2021 by Dr. Charlann Smith.  Her cardiac risk factor profile is notable for treated hypertension and hyperlipidemia.  She has never had a heart attack or stroke.  She denies chest pain or shortness of breath.  I apparently did do a heart catheterization on her over 10 years ago that was normal.  She is scheduled for left total knee replacement by Dr. Charlann Smith 09/19/2021.  Her EKG shows poor R wave progression. ? ? ?Current Meds  ?Medication Sig  ? cetirizine (ZYRTEC) 10 MG tablet Take 10 mg by mouth daily.  ? cholecalciferol (VITAMIN D) 1000 UNITS tablet Take 1,000 Units by mouth daily.  ? Coenzyme Q10 200 MG TABS Take 1 tablet by mouth daily.  ? escitalopram (LEXAPRO) 10 MG tablet Take 10 mg by mouth daily.  ? magnesium oxide (MAG-OX) 400 (241.3 MG) MG tablet Take 400 mg by mouth 2 (two) times daily.  ? metoprolol succinate (TOPROL-XL) 50 MG 24 hr tablet Take 50 mg by mouth daily. Take with or immediately following a meal.  ? Nutritional Supplements (JUICE PLUS FIBRE PO) Take 1 capsule by mouth 2 (two) times daily. "[employer]"  ? rosuvastatin (CRESTOR) 5 MG tablet Take 5 mg by mouth daily.  ? Specialty Vitamins Products (ONE-A-DAY BONE STRENGTH) 500-28-100 MG-MG-UNIT TABS Take 1-2 tablets by mouth 2 (two) times daily. 2 tabs in the am, 1 tab in the pm  ? vitamin C (ASCORBIC ACID) 500 MG tablet Take 500 mg by mouth daily.  ? [DISCONTINUED] Nutritional Supplements (JUICE PLUS FIBRE PO) Take 1 capsule by mouth 2 (two) times daily. "[employer]"  ?  ? ?Allergies  ?Allergen Reactions  ?  Codeine Hives  ? Percocet [Oxycodone-Acetaminophen]   ? ? ?Social History  ? ?Socioeconomic History  ? Marital status: Married  ?  Spouse name: Not on file  ? Number of children: Not on file  ? Years of education: Not on file  ? Highest education level: Not on file  ?Occupational History  ? Not on file  ?Tobacco Use  ? Smoking status: Never  ? Smokeless tobacco: Not on file  ?Substance and Sexual Activity  ? Alcohol use: No  ? Drug use: No  ? Sexual activity: Yes  ?Other Topics Concern  ? Not on file  ?Social History Narrative  ? Not on file  ? ?Social Determinants of Health  ? ?Financial Resource Strain: Not on file  ?Food Insecurity: Not on file  ?Transportation Needs: Not on file  ?Physical Activity: Not on file  ?Stress: Not on file  ?Social Connections: Not on file  ?Intimate Partner Violence: Not on file  ?  ? ?Review of Systems: ?General: negative for chills, fever, night sweats or weight changes.  ?Cardiovascular: negative for chest pain, dyspnea on exertion, edema, orthopnea, palpitations, paroxysmal nocturnal dyspnea or shortness of breath ?Dermatological: negative for rash ?Respiratory: negative for cough or wheezing ?Urologic: negative for hematuria ?Abdominal: negative for nausea, vomiting, diarrhea, bright red blood per rectum, melena, or hematemesis ?Neurologic: negative for visual changes, syncope, or dizziness ?All  other systems reviewed and are otherwise negative except as noted above. ? ? ? ?Blood pressure 116/80, pulse 88, height 5\' 3"  (1.6 m), weight 138 lb (62.6 kg).  ?General appearance: alert and no distress ?Neck: no adenopathy, no carotid bruit, no JVD, supple, symmetrical, trachea midline, and thyroid not enlarged, symmetric, no tenderness/mass/nodules ?Lungs: clear to auscultation bilaterally ?Heart: regular rate and rhythm, S1, S2 normal, no murmur, click, rub or gallop ?Extremities: extremities normal, atraumatic, no cyanosis or edema ?Pulses: 2+ and symmetric ?Skin: Skin color,  texture, turgor normal. No rashes or lesions ?Neurologic: Grossly normal ? 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  I personally reviewed this EKG. ? ?ASSESSMENT AND PLAN:  ? ?Essential hypertension ?History of essential hypertension a blood pressure measured today at 116/80.  She is on metoprolol. ? ?Hyperlipidemia ?History of hyperlipidemia on statin therapy lipid profile performed by her PCP 07/04/2021 revealing a total cholesterol 92, LDL of 98 and HDL 77. ? ?Preoperative clearance ?Mr. Montour is scheduled for left total hip replacement by Dr. 07/06/2021 on 09/19/2021.  Her EKG did show poor R wave progression.  She is totally asymptomatic.  I am going to get a 2D echo to preoperatively clear her. ? ? ? ? ?09/21/2021 Smith FACP,FACC,FAHA, FSCAI ?09/05/2021 ?10:41 AM ?

## 2021-09-05 NOTE — Assessment & Plan Note (Signed)
Kristen Smith is scheduled for left total hip replacement by Dr. Charlann Boxer on 09/19/2021.  Her EKG did show poor R wave progression.  She is totally asymptomatic.  I am going to get a 2D echo to preoperatively clear her. ?

## 2021-09-05 NOTE — Assessment & Plan Note (Signed)
History of hyperlipidemia on statin therapy lipid profile performed by her PCP 07/04/2021 revealing a total cholesterol 92, LDL of 98 and HDL 77. ?

## 2021-09-05 NOTE — Assessment & Plan Note (Signed)
History of essential hypertension a blood pressure measured today at 116/80.  She is on metoprolol. ?

## 2021-09-05 NOTE — Patient Instructions (Signed)
Medication Instructions:  ?Your physician recommends that you continue on your current medications as directed. Please refer to the Current Medication list given to you today. ? ?*If you need a refill on your cardiac medications before your next appointment, please call your pharmacy* ? ? ? ?Testing/Procedures: ?Your physician has requested that you have an echocardiogram. Echocardiography is a painless test that uses sound waves to create images of your heart. It provides your doctor with information about the size and shape of your heart and how well your heart?s chambers and valves are working. This procedure takes approximately one hour. There are no restrictions for this procedure.  [address], 2nd [address] ? ? ? ?Follow-Up: ?At [facility], you and your health needs are our priority.  As part of our continuing mission to provide you with exceptional heart care, we have created designated Provider Care Teams.  These Care Teams include your primary Cardiologist (physician) and Advanced Practice Providers (APPs -  Physician Assistants and Nurse Practitioners) who all work together to provide you with the care you need, when you need it. ? ?We recommend signing up for the patient portal called "MyChart".  Sign up information is provided on this After Visit Summary.  MyChart is used to connect with patients for Virtual Visits (Telemedicine).  Patients are able to view lab/test results, encounter notes, upcoming appointments, etc.  Non-urgent messages can be sent to your provider as well.   ?To learn more about what you can do with MyChart, go to [email or web address].   ? ?Your next appointment:   ?We will see you on an as needed basis. ? ?Provider:   ?Nanetta Batty, MD ?

## 2021-09-06 NOTE — Progress Notes (Addendum)
Anesthesia Review:  PCP: dr Feliciana Rossetti  Cardiologist : DR Jeri Cos- LOV 09/05/2021  Chest x-ray : EKG :09/05/2021  Echo : 09/07/21 at 300pm  Stress test: Cardiac Cath :  Activity level:  can do a flight of stairs without difficulty  Sleep Study/ CPAP : none  Fasting Blood Sugar :      / Checks Blood Sugar -- times a day:   Blood Thinner/ Instructions /Last Dose: ASA / Instructions/ Last Dose :

## 2021-09-06 NOTE — Progress Notes (Addendum)
DUE TO COVID-19 ONLY  2 VISITOR IS ALLOWED TO COME WITH YOU AND STAY IN THE WAITING ROOM ONLY DURING PRE OP AND PROCEDURE DAY OF SURGERY.   4 VISITOR  MAY VISIT WITH YOU AFTER SURGERY IN YOUR PRIVATE ROOM DURING VISITING HOURS ONLY! YOU MAY HAVE ONE PERSON SPEND THE NITE WITH YOU IN YOUR ROOM AFTER SURGERY.     Your procedure is scheduled on:    09/19/21   Report to [facility] Main  Entrance   Report to admitting at   100pm              DO NOT BRING INSURANCE CARD, PICTURE ID OR WALLET DAY OF SURGERY.      Call this number if you have problems the morning of surgery [phone]    REMEMBER: NO  SOLID FOODS , CANDY, GUM OR MINTS AFTER MIDNITE THE NITE BEFORE SURGERY .       Marland Kitchen CLEAR LIQUIDS UNTIL     1245pm             DAY OF SURGERY.    cOMPLETE ENSURE PREOP DRINK BY PM DASY OF SURGERY.    CLEAR LIQUID DIET   Foods Allowed      WATER BLACK COFFEE ( SUGAR OK, NO MILK, CREAM OR CREAMER) REGULAR AND DECAF  TEA ( SUGAR OK NO MILK, CREAM, OR CREAMER) REGULAR AND DECAF  PLAIN JELLO ( NO RED)  FRUIT ICES ( NO RED, NO FRUIT PULP)  POPSICLES ( NO RED)  JUICE- APPLE, WHITE GRAPE AND WHITE CRANBERRY  SPORT DRINK LIKE GATORADE ( NO RED)  CLEAR BROTH ( VEGETABLE , CHICKEN OR BEEF)                                                                     BRUSH YOUR TEETH MORNING OF SURGERY AND RINSE YOUR MOUTH OUT, NO CHEWING GUM CANDY OR MINTS.     Take these medicines the morning of surgery with A SIP OF WATER:  zyrtec    DO NOT TAKE ANY DIABETIC MEDICATIONS DAY OF YOUR SURGERY                               You may not have any metal on your body including hair pins and              piercings  Do not wear jewelry, make-up, lotions, powders or perfumes, deodorant             Do not wear nail polish on your fingernails.              IF YOU ARE A FEMALE AND WANT TO SHAVE UNDER ARMS OR LEGS PRIOR TO SURGERY YOU MUST DO SO AT LEAST 48 HOURS PRIOR TO SURGERY.              Men may shave  face and neck.   Do not bring valuables to the hospital. [facility] IS NOT             RESPONSIBLE   FOR VALUABLES.  Contacts, dentures or bridgework may not be worn into surgery.  Leave suitcase in the car. After surgery it may  be brought to your room.     Patients discharged the day of surgery will not be allowed to drive home. IF YOU ARE HAVING SURGERY AND GOING HOME THE SAME DAY, YOU MUST HAVE AN ADULT TO DRIVE YOU HOME AND BE WITH YOU FOR 24 HOURS. YOU MAY GO HOME BY TAXI OR UBER OR ORTHERWISE, BUT AN ADULT MUST ACCOMPANY YOU HOME AND STAY WITH YOU FOR 24 HOURS.                Please read over the following fact sheets you were given: _____________________________________________________________________  [facility] - Preparing for Surgery Before surgery, you can play an important role.  Because skin is not sterile, your skin needs to be as free of germs as possible.  You can reduce the number of germs on your skin by washing with CHG (chlorahexidine gluconate) soap before surgery.  CHG is an antiseptic cleaner which kills germs and bonds with the skin to continue killing germs even after washing. Please DO NOT use if you have an allergy to CHG or antibacterial soaps.  If your skin becomes reddened/irritated stop using the CHG and inform your nurse when you arrive at Short Stay. Do not shave (including legs and underarms) for at least 48 hours prior to the first CHG shower.  You may shave your face/neck. Please follow these instructions carefully:  1.  Shower with CHG Soap the night before surgery and the  morning of Surgery.  2.  If you choose to wash your hair, wash your hair first as usual with your  normal  shampoo.  3.  After you shampoo, rinse your hair and body thoroughly to remove the  shampoo.                           4.  Use CHG as you would any other liquid soap.  You can apply chg directly  to the skin and wash                       Gently with a scrungie or clean  washcloth.  5.  Apply the CHG Soap to your body ONLY FROM THE NECK DOWN.   Do not use on face/ open                           Wound or open sores. Avoid contact with eyes, ears mouth and genitals (private parts).                       Wash face,  Genitals (private parts) with your normal soap.             6.  Wash thoroughly, paying special attention to the area where your surgery  will be performed.  7.  Thoroughly rinse your body with warm water from the neck down.  8.  DO NOT shower/wash with your normal soap after using and rinsing off  the CHG Soap.                9.  Pat yourself dry with a clean towel.            10.  Wear clean pajamas.            11.  Place clean sheets on your bed the night of your first shower and do not  sleep with pets. Day  of Surgery : Do not apply any lotions/deodorants the morning of surgery.  Please wear clean clothes to the hospital/surgery center.  FAILURE TO FOLLOW THESE INSTRUCTIONS MAY RESULT IN THE CANCELLATION OF YOUR SURGERY PATIENT SIGNATURE_________________________________  NURSE SIGNATURE__________________________________  ________________________________________________________________________

## 2021-09-07 ENCOUNTER — Ambulatory Visit (INDEPENDENT_AMBULATORY_CARE_PROVIDER_SITE_OTHER): Payer: Medicare HMO

## 2021-09-07 DIAGNOSIS — Z01810 Encounter for preprocedural cardiovascular examination: Secondary | ICD-10-CM | POA: Diagnosis not present

## 2021-09-07 DIAGNOSIS — E782 Mixed hyperlipidemia: Secondary | ICD-10-CM | POA: Diagnosis not present

## 2021-09-07 DIAGNOSIS — I10 Essential (primary) hypertension: Secondary | ICD-10-CM

## 2021-09-07 DIAGNOSIS — Z01818 Encounter for other preprocedural examination: Secondary | ICD-10-CM

## 2021-09-07 LAB — ECHOCARDIOGRAM COMPLETE
AR max vel: 3.01 cm2
AV Area VTI: 2.5 cm2
AV Area mean vel: 2.55 cm2
AV Mean grad: 4 mmHg
AV Peak grad: 6.7 mmHg
Ao pk vel: 1.29 m/s
Area-P 1/2: 4.15 cm2
Calc EF: 59.1 %
S' Lateral: 2.49 cm
Single Plane A2C EF: 52.6 %
Single Plane A4C EF: 62.8 %

## 2021-09-08 NOTE — Telephone Encounter (Signed)
Runell Gess, MD  Bernita Buffy, RN Essentially nl 2D. Cleared for THR at low risk

## 2021-09-08 NOTE — Progress Notes (Signed)
Surgery on 09/19/21. Preop on 09/11/21.  Need orders in epic.  Thanks.

## 2021-09-11 ENCOUNTER — Encounter (HOSPITAL_COMMUNITY): Payer: Self-pay

## 2021-09-11 ENCOUNTER — Other Ambulatory Visit: Payer: Self-pay

## 2021-09-11 ENCOUNTER — Encounter (HOSPITAL_COMMUNITY)
Admission: RE | Admit: 2021-09-11 | Discharge: 2021-09-11 | Disposition: A | Discharge status: Home / Self Care | Payer: Medicare HMO | Source: Ambulatory Visit | Attending: Orthopedic Surgery | Admitting: Orthopedic Surgery

## 2021-09-11 VITALS — BP 137/81 | HR 65 | Temp 98.2°F | Resp 16 | Ht 64.0 in | Wt 123.0 lb

## 2021-09-11 DIAGNOSIS — M1612 Unilateral primary osteoarthritis, left hip: Secondary | ICD-10-CM | POA: Diagnosis not present

## 2021-09-11 DIAGNOSIS — Z01812 Encounter for preprocedural laboratory examination: Secondary | ICD-10-CM | POA: Diagnosis present

## 2021-09-11 DIAGNOSIS — Z01818 Encounter for other preprocedural examination: Secondary | ICD-10-CM

## 2021-09-11 DIAGNOSIS — I10 Essential (primary) hypertension: Secondary | ICD-10-CM | POA: Insufficient documentation

## 2021-09-11 HISTORY — DX: Essential (primary) hypertension: I10

## 2021-09-11 HISTORY — DX: Anxiety disorder, unspecified: F41.9

## 2021-09-11 LAB — CBC
HCT: 37.8 % (ref 36.0–46.0)
Hemoglobin: 13.1 g/dL (ref 12.0–15.0)
MCH: 32.9 pg (ref 26.0–34.0)
MCHC: 34.7 g/dL (ref 30.0–36.0)
MCV: 95 fL (ref 80.0–100.0)
Platelets: 322 10*3/uL (ref 150–400)
RBC: 3.98 MIL/uL (ref 3.87–5.11)
RDW: 12.7 % (ref 11.5–15.5)
WBC: 6.5 10*3/uL (ref 4.0–10.5)
nRBC: 0 % (ref 0.0–0.2)

## 2021-09-11 LAB — BASIC METABOLIC PANEL
Anion gap: 10 (ref 5–15)
BUN: 13 mg/dL (ref 8–23)
CO2: 24 mmol/L (ref 22–32)
Calcium: 9.6 mg/dL (ref 8.9–10.3)
Chloride: 100 mmol/L (ref 98–111)
Creatinine, Ser: 0.75 mg/dL (ref 0.44–1.00)
GFR, Estimated: >60 mL/min (ref >60)
Glucose, Bld: 96 mg/dL (ref 70–99)
Potassium: 4.1 mmol/L (ref 3.5–5.1)
Sodium: 134 mmol/L — ABNORMAL LOW (ref 135–145)

## 2021-09-11 LAB — SURGICAL PCR SCREEN
MRSA, PCR: NEGATIVE
Staphylococcus aureus: NEGATIVE

## 2021-09-11 NOTE — Progress Notes (Signed)
Anesthesia Chart Review   Case: [id number] Date/Time: 09/19/21 1240   Procedure: TOTAL HIP ARTHROPLASTY ANTERIOR APPROACH (Left: Hip)   Anesthesia type: Spinal   Pre-op diagnosis: Left hip osteoarthritis   Location: [facility] ROOM 09 / [facility]   Surgeons: Durene Romans, MD       DISCUSSION:81 y.o. never smoker with h/o PONV, HTN, left hip OA scheduled for above procedure 09/19/2021 with Dr. Durene Romans.   Pt seen by cardiology 09/05/2021 for preoperative evaluation.  Per OV note, "Preoperative clearance Mr. Verhoeven is scheduled for left total hip replacement by Dr. Charlann Boxer on 09/19/2021.  Her EKG did show poor R wave progression.  She is totally asymptomatic.  I am going to get a 2D echo to preoperatively clear her."  Echo 09/07/2021, per results, "Essentially nl 2D. Cleared for THR at low risk"  Anticipate pt can proceed with planned procedure barring acute status change.   VS: BP 137/81   Pulse 65   Temp 36.8 C (Oral)   Resp 16   Ht 5\' 4"  (1.626 m)   Wt 55.8 kg   SpO2 100%   BMI 21.11 kg/m   PROVIDERS: ., MD is PCP   Gordan Payment, MD is Cardiologist  LABS: Labs reviewed: Acceptable for surgery. (all labs ordered are listed, but only abnormal results are displayed)  Labs Reviewed  BASIC METABOLIC PANEL - Abnormal; Notable for the following components:      Result Value   Sodium 134 (*)    All other components within normal limits  SURGICAL PCR SCREEN  CBC  TYPE AND SCREEN     IMAGES:   EKG: 09/05/2021 Rate 88 bpm  NSR  CV: Echo 09/07/2021  1. Left ventricular ejection fraction, by estimation, is 60 to 65%. The  left ventricle has normal function. The left ventricle has no regional  wall motion abnormalities. Left ventricular diastolic parameters are  consistent with Grade I diastolic  dysfunction (impaired relaxation). Elevated left atrial pressure. The  average left ventricular global longitudinal strain is -17.4 %. The global  longitudinal strain is  normal.   2. Right ventricular systolic function is normal. The right ventricular  size is normal.   3. Left atrial size was mildly dilated.   4. The mitral valve is normal in structure. Trivial mitral valve  regurgitation. No evidence of mitral stenosis.   5. The aortic valve is tricuspid. Aortic valve regurgitation is not  visualized. Aortic valve sclerosis is present, with no evidence of aortic  valve stenosis.   6. The inferior vena cava is normal in size with greater than 50%  respiratory variability, suggesting right atrial pressure of 3 mmHg. Past Medical History:  Diagnosis Date   Anxiety    Arthritis    Dysrhythmia    Hypertension    PONV (postoperative nausea and vomiting) 2003 ??   at [facility].     Past Surgical History:  Procedure Laterality Date   ABDOMINAL HYSTERECTOMY     CARDIAC CATHETERIZATION     when pt  had sxs ..irregular heart beat and chest pain .   PARTIAL MASTECTOMY WITH NEEDLE LOCALIZATION  04/29/2012   Procedure: PARTIAL MASTECTOMY WITH NEEDLE LOCALIZATION;  Surgeon: 06/27/2012 A. Cornett, MD;  Location: [facility] OR;  Service: General;  Laterality: Left;  left breast partial mastectomy with needle localization   ROTATOR CUFF REPAIR     TONSILLECTOMY     as a child    MEDICATIONS:  ALPRAZolam (XANAX) 0.5 MG tablet  Calcium Carb-Cholecalciferol (CALCIUM 600+D3 PO)   cetirizine (ZYRTEC) 10 MG tablet   escitalopram (LEXAPRO) 10 MG tablet   ibuprofen (ADVIL) 200 MG tablet   metoprolol succinate (TOPROL-XL) 50 MG 24 hr tablet   Nutritional Supplements (JUICE PLUS FIBRE PO)   rosuvastatin (CRESTOR) 5 MG tablet   vitamin B-12 (CYANOCOBALAMIN) 1000 MCG tablet   vitamin C (ASCORBIC ACID) 500 MG tablet   No current facility-administered medications for this encounter.    Jodell Cipro Ward, PA-C [facility] Pre-Surgical Testing [phone]

## 2021-09-11 NOTE — Anesthesia Preprocedure Evaluation (Addendum)
Anesthesia Evaluation  Patient identified by MRN, date of birth, ID band Patient awake    Reviewed: Allergy & Precautions, NPO status , Patient's Chart, lab work & pertinent test results  History of Anesthesia Complications (+) PONV and history of anesthetic complications  Airway Mallampati: II  TM Distance: >3 FB Neck ROM: Full    Dental no notable dental hx. (+) Dental Advisory Given, Teeth Intact   Pulmonary neg pulmonary ROS,    Pulmonary exam normal breath sounds clear to auscultation       Cardiovascular hypertension, Pt. on home beta blockers Normal cardiovascular exam+ dysrhythmias  Rhythm:Regular Rate:Normal  Echo 08/2021 1. Left ventricular ejection fraction, by estimation, is 60 to 65%. The left ventricle has normal function. The left ventricle has no regional wall motion abnormalities. Left ventricular diastolic parameters are consistent with Grade I diastolic dysfunction (impaired relaxation). Elevated left atrial pressure. The average left ventricular global longitudinal strain is -17.4 %. The global longitudinal strain is normal.  2. Right ventricular systolic function is normal. The right ventricular size is normal.  3. Left atrial size was mildly dilated.  4. The mitral valve is normal in structure. Trivial mitral valve regurgitation. No evidence of mitral stenosis.  5. The aortic valve is tricuspid. Aortic valve regurgitation is not visualized. Aortic valve sclerosis is present, with no evidence of aortic valve stenosis.  6. The inferior vena cava is normal in size with greater than 50% respiratory variability, suggesting right atrial pressure of 3 mmHg.    Neuro/Psych PSYCHIATRIC DISORDERS Anxiety negative neurological ROS     GI/Hepatic negative GI ROS, Neg liver ROS,   Endo/Other  negative endocrine ROS  Renal/GU negative Renal ROS     Musculoskeletal  (+) Arthritis ,   Abdominal   Peds   Hematology negative hematology ROS (+)   Anesthesia Other Findings   Reproductive/Obstetrics                           Anesthesia Physical Anesthesia Plan  ASA: 2  Anesthesia Plan: Spinal   Post-op Pain Management: Tylenol PO (pre-op)* and Celebrex PO (pre-op)*   Induction: Intravenous  PONV Risk Score and Plan: 4 or greater and Ondansetron, Dexamethasone, Treatment may vary due to age or medical condition, Propofol infusion and TIVA  Airway Management Planned: Natural Airway  Additional Equipment:   Intra-op Plan:   Post-operative Plan:   Informed Consent: I have reviewed the patients History and Physical, chart, labs and discussed the procedure including the risks, benefits and alternatives for the proposed anesthesia with the patient or authorized representative who has indicated his/her understanding and acceptance.     Dental advisory given  Plan Discussed with: CRNA  Anesthesia Plan Comments: (See PAT note 09/11/2021)      Anesthesia Quick Evaluation

## 2021-09-19 ENCOUNTER — Ambulatory Visit (HOSPITAL_BASED_OUTPATIENT_CLINIC_OR_DEPARTMENT_OTHER): Payer: Medicare HMO | Admitting: Certified Registered Nurse Anesthetist

## 2021-09-19 ENCOUNTER — Observation Stay (HOSPITAL_COMMUNITY): Payer: Medicare HMO

## 2021-09-19 ENCOUNTER — Other Ambulatory Visit: Payer: Self-pay

## 2021-09-19 ENCOUNTER — Inpatient Hospital Stay (HOSPITAL_COMMUNITY)
Admission: RE | Admit: 2021-09-19 | Discharge: 2021-09-22 | DRG: 470 | Disposition: A | Discharge status: Home / Self Care | Payer: Medicare HMO | Attending: Orthopedic Surgery | Admitting: Orthopedic Surgery

## 2021-09-19 ENCOUNTER — Encounter (HOSPITAL_COMMUNITY)
Admission: RE | Disposition: A | Discharge status: Home / Self Care | Payer: Self-pay | Source: Home / Self Care | Attending: Orthopedic Surgery

## 2021-09-19 ENCOUNTER — Encounter (HOSPITAL_COMMUNITY): Payer: Self-pay | Admitting: Orthopedic Surgery

## 2021-09-19 ENCOUNTER — Ambulatory Visit (HOSPITAL_COMMUNITY): Payer: Medicare HMO | Admitting: Physician Assistant

## 2021-09-19 DIAGNOSIS — R410 Disorientation, unspecified: Secondary | ICD-10-CM | POA: Diagnosis present

## 2021-09-19 DIAGNOSIS — Z79899 Other long term (current) drug therapy: Secondary | ICD-10-CM

## 2021-09-19 DIAGNOSIS — T426X5A Adverse effect of other antiepileptic and sedative-hypnotic drugs, initial encounter: Secondary | ICD-10-CM | POA: Diagnosis present

## 2021-09-19 DIAGNOSIS — F419 Anxiety disorder, unspecified: Secondary | ICD-10-CM | POA: Diagnosis present

## 2021-09-19 DIAGNOSIS — M1612 Unilateral primary osteoarthritis, left hip: Secondary | ICD-10-CM

## 2021-09-19 DIAGNOSIS — Z885 Allergy status to narcotic agent status: Secondary | ICD-10-CM

## 2021-09-19 DIAGNOSIS — I10 Essential (primary) hypertension: Secondary | ICD-10-CM | POA: Diagnosis present

## 2021-09-19 DIAGNOSIS — Z01818 Encounter for other preprocedural examination: Secondary | ICD-10-CM

## 2021-09-19 DIAGNOSIS — E785 Hyperlipidemia, unspecified: Secondary | ICD-10-CM | POA: Diagnosis present

## 2021-09-19 DIAGNOSIS — Z96642 Presence of left artificial hip joint: Secondary | ICD-10-CM

## 2021-09-19 DIAGNOSIS — Z820 Family history of epilepsy and other diseases of the nervous system: Secondary | ICD-10-CM

## 2021-09-19 DIAGNOSIS — Z96649 Presence of unspecified artificial hip joint: Secondary | ICD-10-CM

## 2021-09-19 DIAGNOSIS — R112 Nausea with vomiting, unspecified: Secondary | ICD-10-CM | POA: Diagnosis present

## 2021-09-19 HISTORY — PX: TOTAL HIP ARTHROPLASTY: SHX124

## 2021-09-19 LAB — TYPE AND SCREEN
ABO/RH(D): A POS
Antibody Screen: NEGATIVE

## 2021-09-19 LAB — ABO/RH: ABO/RH(D): A POS

## 2021-09-19 SURGERY — ARTHROPLASTY, HIP, TOTAL, ANTERIOR APPROACH
Anesthesia: Spinal | Site: Hip | Laterality: Left

## 2021-09-19 MED ORDER — EPHEDRINE SULFATE-NACL 50-0.9 MG/10ML-% IV SOSY
PREFILLED_SYRINGE | INTRAVENOUS | Status: DC | PRN
  Administered 2021-09-19 (×2): 10 mg via INTRAVENOUS
  Administered 2021-09-19: 5 mg via INTRAVENOUS

## 2021-09-19 MED ORDER — HYDROMORPHONE HCL 1 MG/ML IJ SOLN
0.5000 mg | INTRAMUSCULAR | Status: DC | PRN
  Administered 2021-09-20: 1 mg via INTRAVENOUS
  Filled 2021-09-19: qty 1

## 2021-09-19 MED ORDER — METHOCARBAMOL 500 MG PO TABS
500.0000 mg | ORAL_TABLET | Freq: Four times a day (QID) | ORAL | Status: DC | PRN
  Administered 2021-09-20 – 2021-09-22 (×3): 500 mg via ORAL
  Filled 2021-09-19 (×3): qty 1

## 2021-09-19 MED ORDER — LACTATED RINGERS IV SOLN: INTRAVENOUS | Status: DC

## 2021-09-19 MED ORDER — LORATADINE 10 MG PO TABS
10.0000 mg | ORAL_TABLET | Freq: Every day | ORAL | Status: DC
  Administered 2021-09-20 – 2021-09-22 (×3): 10 mg via ORAL
  Filled 2021-09-19 (×4): qty 1

## 2021-09-19 MED ORDER — DEXAMETHASONE SODIUM PHOSPHATE 10 MG/ML IJ SOLN
10.0000 mg | Freq: Once | INTRAMUSCULAR | Status: AC
  Administered 2021-09-20: 10 mg via INTRAVENOUS
  Filled 2021-09-19: qty 1

## 2021-09-19 MED ORDER — ONDANSETRON HCL 4 MG/2ML IJ SOLN
INTRAMUSCULAR | Status: DC | PRN
  Administered 2021-09-19: 4 mg via INTRAVENOUS

## 2021-09-19 MED ORDER — ACETAMINOPHEN 500 MG PO TABS
ORAL_TABLET | ORAL | Status: AC
  Administered 2021-09-19: 1000 mg
  Filled 2021-09-19: qty 2

## 2021-09-19 MED ORDER — METOPROLOL SUCCINATE ER 50 MG PO TB24
50.0000 mg | ORAL_TABLET | Freq: Every evening | ORAL | Status: DC
  Administered 2021-09-19 – 2021-09-21 (×3): 50 mg via ORAL
  Filled 2021-09-19 (×3): qty 1

## 2021-09-19 MED ORDER — SODIUM CHLORIDE 0.9 % IR SOLN
Status: DC | PRN
  Administered 2021-09-19: 1000 mL

## 2021-09-19 MED ORDER — MELOXICAM 15 MG PO TABS
15.0000 mg | ORAL_TABLET | Freq: Every day | ORAL | Status: DC
  Administered 2021-09-20 – 2021-09-22 (×3): 15 mg via ORAL
  Filled 2021-09-19 (×4): qty 1

## 2021-09-19 MED ORDER — ESCITALOPRAM OXALATE 10 MG PO TABS
10.0000 mg | ORAL_TABLET | Freq: Every evening | ORAL | Status: DC
  Administered 2021-09-19 – 2021-09-21 (×3): 10 mg via ORAL
  Filled 2021-09-19 (×3): qty 1

## 2021-09-19 MED ORDER — ONDANSETRON HCL 4 MG PO TABS
4.0000 mg | ORAL_TABLET | Freq: Four times a day (QID) | ORAL | Status: DC | PRN
  Administered 2021-09-19: 4 mg via ORAL
  Filled 2021-09-19: qty 1

## 2021-09-19 MED ORDER — PHENOL 1.4 % MT LIQD: 1.0000 | OROMUCOSAL | Status: DC | PRN

## 2021-09-19 MED ORDER — SODIUM CHLORIDE 0.9 % IV SOLN: INTRAVENOUS | Status: DC

## 2021-09-19 MED ORDER — HYDROMORPHONE HCL 1 MG/ML IJ SOLN
0.2500 mg | INTRAMUSCULAR | Status: DC | PRN
  Administered 2021-09-19: 0.25 mg via INTRAVENOUS
  Administered 2021-09-19: 0.5 mg via INTRAVENOUS

## 2021-09-19 MED ORDER — POVIDONE-IODINE 10 % EX SWAB
2.0000 "application " | Freq: Once | CUTANEOUS | Status: AC
  Administered 2021-09-19: 2 via TOPICAL

## 2021-09-19 MED ORDER — CEFAZOLIN SODIUM-DEXTROSE 2-4 GM/100ML-% IV SOLN
2.0000 g | Freq: Four times a day (QID) | INTRAVENOUS | Status: AC
  Administered 2021-09-19 (×2): 2 g via INTRAVENOUS
  Filled 2021-09-19 (×2): qty 100

## 2021-09-19 MED ORDER — PROPOFOL 10 MG/ML IV BOLUS
INTRAVENOUS | Status: DC | PRN
  Administered 2021-09-19: 30 mg via INTRAVENOUS
  Administered 2021-09-19: 50 mg via INTRAVENOUS
  Administered 2021-09-19: 20 mg via INTRAVENOUS

## 2021-09-19 MED ORDER — TRANEXAMIC ACID-NACL 1000-0.7 MG/100ML-% IV SOLN
1000.0000 mg | Freq: Once | INTRAVENOUS | Status: AC
  Administered 2021-09-19: 1000 mg via INTRAVENOUS
  Filled 2021-09-19: qty 100

## 2021-09-19 MED ORDER — POLYETHYLENE GLYCOL 3350 17 G PO PACK
17.0000 g | PACK | Freq: Every day | ORAL | Status: DC | PRN
  Administered 2021-09-22: 17 g via ORAL
  Filled 2021-09-19: qty 1

## 2021-09-19 MED ORDER — HYDROMORPHONE HCL 1 MG/ML IJ SOLN
INTRAMUSCULAR | Status: AC
  Administered 2021-09-19: 0.5 mg via INTRAVENOUS
  Filled 2021-09-19: qty 2

## 2021-09-19 MED ORDER — ASPIRIN 81 MG PO CHEW
81.0000 mg | CHEWABLE_TABLET | Freq: Two times a day (BID) | ORAL | Status: DC
Start: 2021-09-19 — End: 2021-09-22
  Administered 2021-09-19 – 2021-09-22 (×6): 81 mg via ORAL
  Filled 2021-09-19 (×6): qty 1

## 2021-09-19 MED ORDER — BISACODYL 10 MG RE SUPP: 10.0000 mg | Freq: Every day | RECTAL | Status: DC | PRN

## 2021-09-19 MED ORDER — CELECOXIB 200 MG PO CAPS
ORAL_CAPSULE | ORAL | Status: AC
  Administered 2021-09-19: 200 mg
  Filled 2021-09-19: qty 1

## 2021-09-19 MED ORDER — HYDROMORPHONE HCL 2 MG/ML IJ SOLN
INTRAMUSCULAR | Status: AC
  Filled 2021-09-19: qty 1

## 2021-09-19 MED ORDER — ONDANSETRON HCL 4 MG/2ML IJ SOLN
4.0000 mg | Freq: Four times a day (QID) | INTRAMUSCULAR | Status: DC | PRN
  Administered 2021-09-19 – 2021-09-21 (×4): 4 mg via INTRAVENOUS
  Filled 2021-09-19 (×5): qty 2

## 2021-09-19 MED ORDER — DIPHENHYDRAMINE HCL 12.5 MG/5ML PO ELIX
12.5000 mg | ORAL_SOLUTION | ORAL | Status: DC | PRN
  Administered 2021-09-19: 25 mg via ORAL
  Filled 2021-09-19: qty 10

## 2021-09-19 MED ORDER — METOCLOPRAMIDE HCL 5 MG/ML IJ SOLN
5.0000 mg | Freq: Three times a day (TID) | INTRAMUSCULAR | Status: DC | PRN
  Administered 2021-09-19 – 2021-09-22 (×3): 10 mg via INTRAVENOUS
  Filled 2021-09-19 (×3): qty 2

## 2021-09-19 MED ORDER — PHENYLEPHRINE HCL (PRESSORS) 10 MG/ML IV SOLN
INTRAVENOUS | Status: AC
  Filled 2021-09-19: qty 1

## 2021-09-19 MED ORDER — PHENYLEPHRINE HCL-NACL 20-0.9 MG/250ML-% IV SOLN
INTRAVENOUS | Status: AC
  Filled 2021-09-19: qty 250

## 2021-09-19 MED ORDER — PHENYLEPHRINE HCL (PRESSORS) 10 MG/ML IV SOLN
INTRAVENOUS | Status: AC
Start: 2021-09-19 — End: ?
  Filled 2021-09-19: qty 1

## 2021-09-19 MED ORDER — PROPOFOL 500 MG/50ML IV EMUL
INTRAVENOUS | Status: DC | PRN
  Administered 2021-09-19: 75 ug/kg/min via INTRAVENOUS

## 2021-09-19 MED ORDER — TRAMADOL HCL 50 MG PO TABS
50.0000 mg | ORAL_TABLET | Freq: Four times a day (QID) | ORAL | Status: DC | PRN
  Filled 2021-09-19: qty 2

## 2021-09-19 MED ORDER — PHENYLEPHRINE HCL-NACL 20-0.9 MG/250ML-% IV SOLN
INTRAVENOUS | Status: DC | PRN
  Administered 2021-09-19: 25 ug/min via INTRAVENOUS
  Administered 2021-09-19: 160 ug via INTRAVENOUS

## 2021-09-19 MED ORDER — METHOCARBAMOL 500 MG IVPB - SIMPLE MED
500.0000 mg | Freq: Four times a day (QID) | INTRAVENOUS | Status: DC | PRN
  Administered 2021-09-20: 500 mg via INTRAVENOUS
  Filled 2021-09-19 (×2): qty 50

## 2021-09-19 MED ORDER — CHLORHEXIDINE GLUCONATE 0.12 % MT SOLN
15.0000 mL | Freq: Once | OROMUCOSAL | Status: AC
  Administered 2021-09-19: 15 mL via OROMUCOSAL

## 2021-09-19 MED ORDER — PHENYLEPHRINE HCL-NACL 20-0.9 MG/250ML-% IV SOLN: INTRAVENOUS | Status: DC | PRN

## 2021-09-19 MED ORDER — METOCLOPRAMIDE HCL 5 MG PO TABS
5.0000 mg | ORAL_TABLET | Freq: Three times a day (TID) | ORAL | Status: DC | PRN
  Administered 2021-09-22: 10 mg via ORAL
  Filled 2021-09-19: qty 2

## 2021-09-19 MED ORDER — ORAL CARE MOUTH RINSE: 15.0000 mL | Freq: Once | OROMUCOSAL | Status: AC

## 2021-09-19 MED ORDER — CEFAZOLIN SODIUM-DEXTROSE 2-4 GM/100ML-% IV SOLN
2.0000 g | INTRAVENOUS | Status: AC
  Administered 2021-09-19: 2 g via INTRAVENOUS
  Filled 2021-09-19: qty 100

## 2021-09-19 MED ORDER — BUPIVACAINE IN DEXTROSE 0.75-8.25 % IT SOLN
INTRATHECAL | Status: DC | PRN
  Administered 2021-09-19: 1.6 mL via INTRATHECAL

## 2021-09-19 MED ORDER — DEXAMETHASONE SODIUM PHOSPHATE 10 MG/ML IJ SOLN
8.0000 mg | Freq: Once | INTRAMUSCULAR | Status: AC
  Administered 2021-09-19: 8 mg via INTRAVENOUS

## 2021-09-19 MED ORDER — DOCUSATE SODIUM 100 MG PO CAPS
100.0000 mg | ORAL_CAPSULE | Freq: Two times a day (BID) | ORAL | Status: DC
  Administered 2021-09-19 – 2021-09-22 (×6): 100 mg via ORAL
  Filled 2021-09-19 (×6): qty 1

## 2021-09-19 MED ORDER — ACETAMINOPHEN 500 MG PO TABS
1000.0000 mg | ORAL_TABLET | Freq: Four times a day (QID) | ORAL | Status: AC
  Administered 2021-09-20: 1000 mg via ORAL
  Filled 2021-09-19 (×2): qty 2

## 2021-09-19 MED ORDER — FERROUS SULFATE 325 (65 FE) MG PO TABS
325.0000 mg | ORAL_TABLET | Freq: Three times a day (TID) | ORAL | Status: DC
Start: 2021-09-20 — End: 2021-09-22
  Administered 2021-09-21 – 2021-09-22 (×5): 325 mg via ORAL
  Filled 2021-09-19 (×5): qty 1

## 2021-09-19 MED ORDER — HYDROMORPHONE HCL 1 MG/ML IJ SOLN
INTRAMUSCULAR | Status: DC | PRN
  Administered 2021-09-19: 1 mg via INTRAVENOUS

## 2021-09-19 MED ORDER — ROSUVASTATIN CALCIUM 5 MG PO TABS
5.0000 mg | ORAL_TABLET | Freq: Every evening | ORAL | Status: DC
  Administered 2021-09-19 – 2021-09-21 (×3): 5 mg via ORAL
  Filled 2021-09-19 (×3): qty 1

## 2021-09-19 MED ORDER — MENTHOL 3 MG MT LOZG: 1.0000 | LOZENGE | OROMUCOSAL | Status: DC | PRN

## 2021-09-19 MED ORDER — TRANEXAMIC ACID-NACL 1000-0.7 MG/100ML-% IV SOLN
1000.0000 mg | INTRAVENOUS | Status: AC
  Administered 2021-09-19: 1000 mg via INTRAVENOUS
  Filled 2021-09-19: qty 100

## 2021-09-19 SURGICAL SUPPLY — 47 items
ADH SKN CLS APL DERMABOND .7 (GAUZE/BANDAGES/DRESSINGS) ×1
BAG COUNTER SPONGE SURGICOUNT (BAG) ×1 IMPLANT
BAG DECANTER FOR FLEXI CONT (MISCELLANEOUS) IMPLANT
BAG SPEC THK2 15X12 ZIP CLS (MISCELLANEOUS)
BAG SPNG CNTER NS LX DISP (BAG) ×1
BAG ZIPLOCK 12X15 (MISCELLANEOUS) IMPLANT
BLADE SAG 18X100X1.27 (BLADE) ×3 IMPLANT
COVER PERINEAL POST (MISCELLANEOUS) ×3 IMPLANT
COVER SURGICAL LIGHT HANDLE (MISCELLANEOUS) ×3 IMPLANT
CUP ACET PINNACLE SECTR 50MM (Hips) IMPLANT
DERMABOND ADVANCED (GAUZE/BANDAGES/DRESSINGS) ×1
DERMABOND ADVANCED .7 DNX12 (GAUZE/BANDAGES/DRESSINGS) ×2 IMPLANT
DRAPE FOOT SWITCH (DRAPES) ×3 IMPLANT
DRAPE STERI IOBAN 125X83 (DRAPES) ×3 IMPLANT
DRAPE U-SHAPE 47X51 STRL (DRAPES) ×6 IMPLANT
DRESSING AQUACEL AG SP 3.5X10 (GAUZE/BANDAGES/DRESSINGS) ×2 IMPLANT
DRSG AQUACEL AG SP 3.5X10 (GAUZE/BANDAGES/DRESSINGS) ×2
DURAPREP 26ML APPLICATOR (WOUND CARE) ×3 IMPLANT
ELECT REM PT RETURN 15FT ADLT (MISCELLANEOUS) ×3 IMPLANT
ELIMINATOR HOLE APEX DEPUY (Hips) ×1 IMPLANT
GLOVE BIO SURGEON STRL SZ 6 (GLOVE) ×3 IMPLANT
GLOVE BIOGEL PI IND STRL 6.5 (GLOVE) ×2 IMPLANT
GLOVE BIOGEL PI IND STRL 7.5 (GLOVE) ×2 IMPLANT
GLOVE BIOGEL PI INDICATOR 6.5 (GLOVE) ×1
GLOVE BIOGEL PI INDICATOR 7.5 (GLOVE) ×1
GLOVE ORTHO TXT STRL SZ7.5 (GLOVE) ×6 IMPLANT
GOWN STRL REUS W/ TWL LRG LVL3 (GOWN DISPOSABLE) ×6 IMPLANT
GOWN STRL REUS W/TWL LRG LVL3 (GOWN DISPOSABLE) ×6
HEAD FEM STD 32X+1 STRL (Hips) ×1 IMPLANT
HOLDER FOLEY CATH W/STRAP (MISCELLANEOUS) ×3 IMPLANT
KIT TURNOVER KIT A (KITS) ×1 IMPLANT
LINER ACET PNNCL PLUS4 NEUTRAL (Hips) IMPLANT
PACK ANTERIOR HIP CUSTOM (KITS) ×3 IMPLANT
PINNACLE PLUS 4 NEUTRAL (Hips) ×2 IMPLANT
PINNACLE SECTOR CUP 50MM (Hips) ×2 IMPLANT
SCREW 6.5MMX30MM (Screw) ×1 IMPLANT
STEM FEMORAL SZ 5MM STD ACTIS (Stem) ×1 IMPLANT
SUT MNCRL AB 4-0 PS2 18 (SUTURE) ×3 IMPLANT
SUT STRATAFIX 0 PDS 27 VIOLET (SUTURE) ×2
SUT VIC AB 1 CT1 36 (SUTURE) ×9 IMPLANT
SUT VIC AB 2-0 CT1 27 (SUTURE) ×4
SUT VIC AB 2-0 CT1 TAPERPNT 27 (SUTURE) ×4 IMPLANT
SUTURE STRATFX 0 PDS 27 VIOLET (SUTURE) ×2 IMPLANT
TRAY FOLEY MTR SLVR 14FR STAT (SET/KITS/TRAYS/PACK) ×1 IMPLANT
TRAY FOLEY MTR SLVR 16FR STAT (SET/KITS/TRAYS/PACK) IMPLANT
TUBE SUCTION HIGH CAP CLEAR NV (SUCTIONS) ×3 IMPLANT
WATER STERILE IRR 1000ML POUR (IV SOLUTION) ×3 IMPLANT

## 2021-09-19 NOTE — H&P (Signed)
TOTAL HIP ADMISSION H&P  Patient is admitted for left total hip arthroplasty.  Subjective:  Chief Complaint: left hip pain  HPI: Kristen Smith, 81 y.o. female, has a history of pain and functional disability in the left hip(s) due to arthritis and patient has failed non-surgical conservative treatments for greater than 12 weeks to include NSAID's and/or analgesics and activity modification.  Onset of symptoms was gradual starting 1 years ago with gradually worsening course since that time.The patient noted no past surgery on the left hip(s).  Patient currently rates pain in the left hip at 9 out of 10 with activity. Patient has worsening of pain with activity and weight bearing, pain that interfers with activities of daily living, and pain with passive range of motion. Patient has evidence of joint space narrowing by imaging studies. This condition presents safety issues increasing the risk of falls.   There is no current active infection.  Patient Active Problem List   Diagnosis Date Noted   Essential hypertension 09/05/2021   Hyperlipidemia 09/05/2021   Preoperative clearance 09/05/2021   Post-operative state 05/30/2012   Past Medical History:  Diagnosis Date   Anxiety    Arthritis    Dysrhythmia    Hypertension    PONV (postoperative nausea and vomiting) 2003 ??   at [facility]     Past Surgical History:  Procedure Laterality Date   ABDOMINAL HYSTERECTOMY     CARDIAC CATHETERIZATION     when pt  had sxs ..irregular heart beat and chest pain .   PARTIAL MASTECTOMY WITH NEEDLE LOCALIZATION  04/29/2012   Procedure: PARTIAL MASTECTOMY WITH NEEDLE LOCALIZATION;  Surgeon: Marcello Moores A. Cornett, MD;  Location: [facility];  Service: General;  Laterality: Left;  left breast partial mastectomy with needle localization   ROTATOR CUFF REPAIR     TONSILLECTOMY     as a child    No current facility-administered medications for this encounter.   Current Outpatient Medications  Medication Sig Dispense  Refill Last Dose   ALPRAZolam (XANAX) 0.5 MG tablet Take 0.5 mg by mouth 3 (three) times daily as needed for anxiety.      Calcium Carb-Cholecalciferol (CALCIUM 600+D3 PO) Take 1 tablet by mouth in the morning.      cetirizine (ZYRTEC) 10 MG tablet Take 10 mg by mouth in the morning.      escitalopram (LEXAPRO) 10 MG tablet Take 10 mg by mouth every evening.      ibuprofen (ADVIL) 200 MG tablet Take 400 mg by mouth 2 (two) times daily as needed (pain.).      metoprolol succinate (TOPROL-XL) 50 MG 24 hr tablet Take 50 mg by mouth every evening. Take with or immediately following a meal.      Nutritional Supplements (JUICE PLUS FIBRE PO) Take 2 capsules by mouth in the morning. Vegetable Blend (1) + Fruit Blend (1)      rosuvastatin (CRESTOR) 5 MG tablet Take 5 mg by mouth every evening.      vitamin B-12 (CYANOCOBALAMIN) 1000 MCG tablet Take 1,000 mcg by mouth in the morning.      vitamin C (ASCORBIC ACID) 500 MG tablet Take 500 mg by mouth daily as needed (immune health support).      Allergies  Allergen Reactions   Codeine Hives   Percocet [Oxycodone-Acetaminophen]     Social History   Tobacco Use   Smoking status: Never   Smokeless tobacco: Never  Substance Use Topics   Alcohol use: No  Family History  Problem Relation Age of Onset   Alzheimer's disease Mother      Review of Systems  Constitutional:  Negative for chills and fever.  Respiratory:  Negative for cough and shortness of breath.   Cardiovascular:  Negative for chest pain.  Gastrointestinal:  Negative for nausea and vomiting.  Musculoskeletal:  Positive for arthralgias.    Objective:  Physical Exam Very pleasant 81 year old female awake alert and oriented. She is in no acute distress. She does walk with a cane in her right upper extremity.  Examination of her left hip reveals painful and limited hip flexion internal rotation less than 5 degrees with pelvic tilting and external rotation less than 20 degrees She  does demonstrate cautious effort at trying to hip flex external rotate and abduct her leg to put her shoes and socks on. She otherwise does not have any significant lower extremity edema, erythema or calf tenderness and is intact neurovascular distally   Vital signs in last 24 hours:    Labs:   Estimated body mass index is 21.11 kg/m as calculated from the following:   Height as of 09/11/21: 5\' 4"  (1.626 m).   Weight as of 09/11/21: 55.8 kg.   Imaging Review Plain radiographs demonstrate severe degenerative joint disease of the left hip(s). The bone quality appears to be adequate for age and reported activity level.      Assessment/Plan:  End stage arthritis, left hip(s)  The patient history, physical examination, clinical judgement of the provider and imaging studies are consistent with end stage degenerative joint disease of the left hip(s) and total hip arthroplasty is deemed medically necessary. The treatment options including medical management, injection therapy, arthroscopy and arthroplasty were discussed at length. The risks and benefits of total hip arthroplasty were presented and reviewed. The risks due to aseptic loosening, infection, stiffness, dislocation/subluxation,  thromboembolic complications and other imponderables were discussed.  The patient acknowledged the explanation, agreed to proceed with the plan and consent was signed. Patient is being admitted for inpatient treatment for surgery, pain control, PT, OT, prophylactic antibiotics, VTE prophylaxis, progressive ambulation and ADL's and discharge planning.The patient is planning to be discharged  home  Risks and benefits of the surgery were discussed with the patient and Dr. Alvan Dame at their previous office visit, and the patient has elected to move forward with the aforementioned surgery. Post-operative care plans were discussed with the patient today and all patient questions were answered. Therapy Plans:  HEP Disposition: Home with family Planned DVT Prophylaxis: aspirin 81mg  BID DME needed: walker PCP: Edyth Gunnels, appointment next week TXA: IV Allergies: NKDA Anesthesia Concerns: none BMI: 22 Last HgbA1c: Not diabetic   Other: - tramadol?, tylenol, robaxin, meloxicam (no opioids)    Costella Hatcher, PA-C Orthopedic Surgery [facility] [place] [phone]

## 2021-09-19 NOTE — Interval H&P Note (Signed)
History and Physical Interval Note:  09/19/2021 10:53 AM  Kristen Smith  has presented today for surgery, with the diagnosis of Left hip osteoarthritis.  The various methods of treatment have been discussed with the patient and family. After consideration of risks, benefits and other options for treatment, the patient has consented to  Procedure(s): TOTAL HIP ARTHROPLASTY ANTERIOR APPROACH (Left) as a surgical intervention.  The patient's history has been reviewed, patient examined, no change in status, stable for surgery.  I have reviewed the patient's chart and labs.  Questions were answered to the patient's satisfaction.     Shelda Pal

## 2021-09-19 NOTE — Anesthesia Postprocedure Evaluation (Signed)
Anesthesia Post Note  Patient: Kristen Smith  Procedure(s) Performed: TOTAL HIP ARTHROPLASTY ANTERIOR APPROACH (Left: Hip)     Patient location during evaluation: PACU Anesthesia Type: Spinal Level of consciousness: awake and alert Pain management: pain level controlled Vital Signs Assessment: post-procedure vital signs reviewed and stable Respiratory status: spontaneous breathing Cardiovascular status: stable Anesthetic complications: no   No notable events documented.  Last Vitals:  Vitals:   09/19/21 1545 09/19/21 1734  BP: 116/70 126/71  Pulse: 76 84  Resp: 15   Temp: 36.4 C   SpO2: 98% 100%    Last Pain:  Vitals:   09/19/21 1548  TempSrc:   PainSc: Asleep                 Lewie Loron

## 2021-09-19 NOTE — Op Note (Signed)
NAME:  Kristen Smith                ACCOUNT NO.: [id number]      MEDICAL RECORD NO.: [id number]      FACILITY:  [facility]      PHYSICIAN:  Mauri Pole  DATE OF BIRTH:  07/09/1940     DATE OF PROCEDURE:  09/19/2021                                 OPERATIVE REPORT         PREOPERATIVE DIAGNOSIS: Left  hip osteoarthritis.      POSTOPERATIVE DIAGNOSIS:  Left hip osteoarthritis.      PROCEDURE:  Left total hip replacement through an anterior approach   utilizing DePuy THR system, component size 50 mm pinnacle cup, a size 32+4 neutral   Altrex liner, a size 5 standard Actis stem with a 32+1 Articuleze metal head ball.      SURGEON:  Pietro Cassis. Alvan Dame, M.D.      ASSISTANT:  Costella Hatcher, PA-C     ANESTHESIA:  General and Spinal.      SPECIMENS:  None.      COMPLICATIONS:  None.      BLOOD LOSS:  300 cc     DRAINS:  None.      INDICATION OF THE PROCEDURE:  Kristen Smith is a 81 y.o. female who had   presented to office for evaluation of left hip pain.  Radiographs revealed   progressive degenerative changes with bone-on-bone   articulation of the  hip joint, including subchondral cystic changes and osteophytes.  The patient had painful limited range of   motion significantly affecting their overall quality of life and function.  The patient was failing to    respond to conservative measures including medications and/or injections and activity modification and at this point was ready   to proceed with more definitive measures.  Consent was obtained for   benefit of pain relief.  Specific risks of infection, DVT, component   failure, dislocation, neurovascular injury, and need for revision surgery were reviewed in the office.     PROCEDURE IN DETAIL:  The patient was brought to operative theater.   Once adequate anesthesia, preoperative antibiotics, 2 gm of Ancef, 1 gm of Tranexamic Acid, and 10 mg of Decadron were administered, the patient was positioned  supine on the [employer] table.  Once the patient was safely positioned with adequate padding of boney prominences we predraped out the hip, and used fluoroscopy to confirm orientation of the pelvis.      The left hip was then prepped and draped from proximal iliac crest to   mid thigh with a shower curtain technique.      Time-out was performed identifying the patient, planned procedure, and the appropriate extremity.     An incision was then made 2 cm lateral to the   anterior superior iliac spine extending over the orientation of the   tensor fascia lata muscle and sharp dissection was carried down to the   fascia of the muscle.      The fascia was then incised.  The muscle belly was identified and swept   laterally and retractor placed along the superior neck.  Following   cauterization of the circumflex vessels and removing some pericapsular   fat, a second cobra retractor was placed on the  inferior neck.  A T-capsulotomy was made along the line of the   superior neck to the trochanteric fossa, then extended proximally and   distally.  Tag sutures were placed and the retractors were then placed   intracapsular.  We then identified the trochanteric fossa and   orientation of my neck cut and then made a neck osteotomy with the femur on traction.  The femoral   head was removed without difficulty or complication.  Traction was let   off and retractors were placed posterior and anterior around the   acetabulum.      The labrum and foveal tissue were debrided.  I began reaming with a 45 mm   reamer and reamed up to 49 mm reamer with good bony bed preparation and a 50 mm  cup was chosen.  The final 50 mm Pinnacle cup was then impacted under fluoroscopy to confirm the depth of penetration and orientation with respect to   Abduction and forward flexion.  A screw was placed into the ilium followed by the hole eliminator.  The final   32+4 neutral Altrex liner was impacted with good visualized  rim fit.  The cup was positioned anatomically within the acetabular portion of the pelvis.      At this point, the femur was rolled to 100 degrees.  Further capsule was   released off the inferior aspect of the femoral neck.  I then   released the superior capsule proximally.  With the leg in a neutral position the hook was placed laterally   along the femur under the vastus lateralis origin and elevated manually and then held in position using the hook attachment on the bed.  The leg was then extended and adducted with the leg rolled to 100   degrees of external rotation.  Retractors were placed along the medial calcar and posteriorly over the greater trochanter.  Once the proximal femur was fully   exposed, I used a box osteotome to set orientation.  I then began   broaching with the starting chili pepper broach and passed this by hand and then broached up to 5.  With the 5 broach in place I chose a standard offset neck and did several trial reductions.  The offset was appropriate, leg lengths   appeared to be equal best matched with the +1 head ball trial confirmed radiographically.   Given these findings, I went ahead and dislocated the hip, repositioned all   retractors and positioned the right hip in the extended and abducted position.  The final 5 standard Actis stem was   chosen and it was impacted down to the level of neck cut.  Based on this   and the trial reductions, a final 32+1 Articuleze metal head ball was chosen and   impacted onto a clean and dry trunnion, and the hip was reduced.  The   hip had been irrigated throughout the case again at this point.  I did   reapproximate the superior capsular leaflet to the anterior leaflet   using #1 Vicryl.  The fascia of the   tensor fascia lata muscle was then reapproximated using #1 Vicryl and #0 Stratafix sutures.  The   remaining wound was closed with 2-0 Vicryl and running 4-0 Monocryl.   The hip was cleaned, dried, and dressed  sterilely using Dermabond and   Aquacel dressing.  The patient was then brought   to recovery room in stable condition tolerating the procedure well.  Costella Hatcher, PA-C was present for the entirety of the case involved from   preoperative positioning, perioperative retractor management, general   facilitation of the case, as well as primary wound closure as assistant.            Pietro Cassis Alvan Dame, M.D.        09/19/2021 12:25 PM

## 2021-09-19 NOTE — Anesthesia Procedure Notes (Signed)
Procedure Name: LMA Insertion Date/Time: 09/19/2021 12:57 PM Performed by: Sudie Grumbling, CRNA Pre-anesthesia Checklist: Patient identified, Emergency Drugs available, Suction available and Patient being monitored Patient Re-evaluated:Patient Re-evaluated prior to induction Oxygen Delivery Method: Circle system utilized Preoxygenation: Pre-oxygenation with 100% oxygen Induction Type: IV induction LMA: LMA inserted LMA Size: 4.0 Number of attempts: 1 Placement Confirmation: positive ETCO2 and breath sounds checked- equal and bilateral Tube secured with: Tape Dental Injury: Teeth and Oropharynx as per pre-operative assessment

## 2021-09-19 NOTE — Discharge Instructions (Signed)

## 2021-09-19 NOTE — Anesthesia Procedure Notes (Signed)
Spinal  Patient location during procedure: OR End time: 09/19/2021 12:23 PM Reason for block: surgical anesthesia Staffing Performed: resident/CRNA  Resident/CRNA: Rosaland Lao, CRNA Preanesthetic Checklist Completed: patient identified, IV checked, site marked, risks and benefits discussed, surgical consent, monitors and equipment checked, pre-op evaluation and timeout performed Spinal Block Patient position: sitting Prep: DuraPrep Patient monitoring: heart rate, cardiac monitor, continuous pulse ox and blood pressure Approach: midline Location: L3-4 Injection technique: single-shot Needle Needle type: Pencan  Needle gauge: 24 G Needle length: 10 cm Assessment Sensory level: T4 Events: CSF return

## 2021-09-19 NOTE — Transfer of Care (Signed)
Immediate Anesthesia Transfer of Care Note  Patient: Kristen Smith  Procedure(s) Performed: TOTAL HIP ARTHROPLASTY ANTERIOR APPROACH (Left: Hip)  Patient Location: PACU  Anesthesia Type:General  Level of Consciousness: awake, alert  and oriented  Airway & Oxygen Therapy: Patient Spontanous Breathing and Patient connected to face mask  Post-op Assessment: Report given to RN and Post -op Vital signs reviewed and stable  Post vital signs: Reviewed and stable  Last Vitals:  Vitals Value Taken Time  BP 128/83 09/19/21 1400  Temp    Pulse 77 09/19/21 1402  Resp 25 09/19/21 1402  SpO2 100 % 09/19/21 1402  Vitals shown include unvalidated device data.  Last Pain:  Vitals:   09/19/21 0952  TempSrc: Oral  PainSc: 0-No pain      Patients Stated Pain Goal: 4 ([id number])  Complications: No notable events documented.

## 2021-09-19 NOTE — Progress Notes (Signed)
PT Cancellation Note  Patient Details Name: Kristen Smith MRN: [id number] DOB: Apr 26, 1940   Cancelled Treatment:    Reason Eval/Treat Not Completed: Fatigue/lethargy limiting ability to participate (Pt sleeping very soundly, recently given dilaudid.). Will follow up as schedule and pt status allows.  Jamesetta Geralds, PT, DPT [facility] Rehabilitation Department Office: [phone] Pager: [phone]  Jamesetta Geralds 09/19/2021, 4:52 PM

## 2021-09-20 ENCOUNTER — Encounter (HOSPITAL_COMMUNITY): Payer: Self-pay | Admitting: Orthopedic Surgery

## 2021-09-20 LAB — CBC
HCT: 33.5 % — ABNORMAL LOW (ref 36.0–46.0)
Hemoglobin: 10.8 g/dL — ABNORMAL LOW (ref 12.0–15.0)
MCH: 31.6 pg (ref 26.0–34.0)
MCHC: 32.2 g/dL (ref 30.0–36.0)
MCV: 98 fL (ref 80.0–100.0)
Platelets: 301 10*3/uL (ref 150–400)
RBC: 3.42 MIL/uL — ABNORMAL LOW (ref 3.87–5.11)
RDW: 12.8 % (ref 11.5–15.5)
WBC: 13.5 10*3/uL — ABNORMAL HIGH (ref 4.0–10.5)
nRBC: 0 % (ref 0.0–0.2)

## 2021-09-20 LAB — BASIC METABOLIC PANEL
Anion gap: 11 (ref 5–15)
BUN: 10 mg/dL (ref 8–23)
CO2: 23 mmol/L (ref 22–32)
Calcium: 8.6 mg/dL — ABNORMAL LOW (ref 8.9–10.3)
Chloride: 101 mmol/L (ref 98–111)
Creatinine, Ser: 0.74 mg/dL (ref 0.44–1.00)
GFR, Estimated: >60 mL/min (ref >60)
Glucose, Bld: 171 mg/dL — ABNORMAL HIGH (ref 70–99)
Potassium: 3.6 mmol/L (ref 3.5–5.1)
Sodium: 135 mmol/L (ref 135–145)

## 2021-09-20 MED ORDER — TRAMADOL HCL 50 MG PO TABS
50.0000 mg | ORAL_TABLET | Freq: Four times a day (QID) | ORAL | Status: DC | PRN
  Administered 2021-09-20 (×2): 50 mg via ORAL
  Administered 2021-09-21: 100 mg via ORAL
  Administered 2021-09-21: 50 mg via ORAL
  Administered 2021-09-21 – 2021-09-22 (×2): 100 mg via ORAL
  Filled 2021-09-20: qty 2
  Filled 2021-09-20: qty 1
  Filled 2021-09-20 (×2): qty 2
  Filled 2021-09-20: qty 1
  Filled 2021-09-20 (×2): qty 2

## 2021-09-20 MED ORDER — DOCUSATE SODIUM 100 MG PO CAPS: 100.0000 mg | ORAL_CAPSULE | Freq: Two times a day (BID) | ORAL | 0 refills | Status: DC

## 2021-09-20 MED ORDER — METHOCARBAMOL 500 MG PO TABS: 500.0000 mg | ORAL_TABLET | Freq: Four times a day (QID) | ORAL | 0 refills | Status: DC | PRN

## 2021-09-20 MED ORDER — HYDROMORPHONE HCL 2 MG PO TABS
1.0000 mg | ORAL_TABLET | ORAL | Status: DC | PRN
  Administered 2021-09-20: 2 mg via ORAL
  Filled 2021-09-20: qty 1

## 2021-09-20 MED ORDER — SODIUM CHLORIDE 0.9 % IV SOLN
12.5000 mg | Freq: Three times a day (TID) | INTRAVENOUS | Status: DC | PRN
  Administered 2021-09-20: 12.5 mg via INTRAVENOUS
  Filled 2021-09-20: qty 12.5

## 2021-09-20 MED ORDER — KETOROLAC TROMETHAMINE 15 MG/ML IJ SOLN
7.5000 mg | Freq: Once | INTRAMUSCULAR | Status: AC
  Administered 2021-09-20: 7.5 mg via INTRAVENOUS
  Filled 2021-09-20: qty 1

## 2021-09-20 MED ORDER — ACETAMINOPHEN 500 MG PO TABS: 1000.0000 mg | ORAL_TABLET | Freq: Four times a day (QID) | ORAL | 0 refills | Status: DC

## 2021-09-20 MED ORDER — POLYETHYLENE GLYCOL 3350 17 G PO PACK: 17.0000 g | PACK | Freq: Every day | ORAL | 0 refills | Status: DC | PRN

## 2021-09-20 MED ORDER — ASPIRIN 81 MG PO CHEW: 81.0000 mg | CHEWABLE_TABLET | Freq: Two times a day (BID) | ORAL | 0 refills | Status: AC

## 2021-09-20 MED ORDER — MELOXICAM 15 MG PO TABS: 15.0000 mg | ORAL_TABLET | Freq: Every day | ORAL | 0 refills | Status: DC

## 2021-09-20 MED ORDER — ONDANSETRON HCL 4 MG PO TABS: 4.0000 mg | ORAL_TABLET | Freq: Four times a day (QID) | ORAL | 0 refills | Status: DC | PRN

## 2021-09-20 NOTE — Progress Notes (Signed)
Talked with patient's daughter this evening. She had persistent issues today with nausea and then drowsiness/confusion related to phenergan. Will give one extra dose of toradol for pain relief tonight. Will change back to tramadol.   She will require overnight stay tonight to continue addressing these issues for safe d/c tomorrow.  Rosalene Billings, PA-C Orthopedic Surgery [facility] [place] [phone]

## 2021-09-20 NOTE — Evaluation (Signed)
Physical Therapy Evaluation Patient Details Name: Kristen Smith MRN: [id number] DOB: 07/13/1940 Today's Date: 09/20/2021  History of Present Illness  Pt is a 81yo female presenting s/p L-THA, AA on 09/19/21. PMH: Anxiety, HTN.  Clinical Impression  Pt is s/p THA resulting in the deficits listed below (see PT Problem List). Pt will benefit from skilled PT to increase their independence and safety with mobility to allow discharge to the venue listed below.  Pt feeling nausea however agreeable to attempt mobility. Pt assisted with ambulating short distance however limited by nausea.  Pt anticipates d/c home when feeling better.  Daughter present today and plans to be with pt at home.         Recommendations for follow up therapy are one component of a multi-disciplinary discharge planning process, led by the attending physician.  Recommendations may be updated based on patient status, additional functional criteria and insurance authorization.  Follow Up Recommendations Follow physician's recommendations for discharge plan and follow up therapies    Assistance Recommended at Discharge    Patient can return home with the following  A little help with walking and/or transfers;A little help with bathing/dressing/bathroom    Equipment Recommendations None recommended by PT  Recommendations for Other Services       Functional Status Assessment Patient has had a recent decline in their functional status and demonstrates the ability to make significant improvements in function in a reasonable and predictable amount of time.     Precautions / Restrictions Precautions Precautions: Fall Restrictions LLE Weight Bearing: Weight bearing as tolerated      Mobility  Bed Mobility Overal bed mobility: Needs Assistance Bed Mobility: Supine to Sit     Supine to sit: Min assist     General bed mobility comments: light assist to scoot to EOB, cues for self assist    Transfers Overall transfer  level: Needs assistance Equipment used: Rolling walker (2 wheels) Transfers: Sit to/from Stand Sit to Stand: Min assist           General transfer comment: verbal cues for hand placement and positioning, light assist to rise    Ambulation/Gait Ambulation/Gait assistance: Min guard Gait Distance (Feet): 26 Feet Assistive device: Rolling walker (2 wheels) Gait Pattern/deviations: Step-to pattern, Decreased stance time - left, Antalgic       General Gait Details: verbal cues for sequence, step length, RW positioning; pt with nausea limiting distance  Stairs            Wheelchair Mobility    Modified Rankin (Stroke Patients Only)       Balance                                             Pertinent Vitals/Pain Pain Assessment Pain Assessment: 0-10 Pain Score: 4  Pain Location: left hip Pain Descriptors / Indicators: Aching, Sore Pain Intervention(s): Monitored during session, Premedicated before session, Repositioned    Home Living Family/patient expects to be discharged to:: Private residence Living Arrangements: Spouse/significant other Available Help at Discharge: Family;Available 24 hours/day Type of Home: House Home Access: Level entry       Home Layout: One level Home Equipment: Agricultural consultant (2 wheels)      Prior Function Prior Level of Function : Independent/Modified Independent  Hand Dominance        Extremity/Trunk Assessment        Lower Extremity Assessment Lower Extremity Assessment: LLE deficits/detail LLE Deficits / Details: anticipated post op left hip weakness, able to perform ankle pumps       Communication   Communication: No difficulties  Cognition Arousal/Alertness: Awake/alert Behavior During Therapy: WFL for tasks assessed/performed Overall Cognitive Status: Within Functional Limits for tasks assessed                                          General  Comments      Exercises     Assessment/Plan    PT Assessment Patient needs continued PT services  PT Problem List Decreased strength;Decreased activity tolerance;Decreased mobility;Decreased knowledge of precautions;Decreased range of motion;Decreased knowledge of use of DME       PT Treatment Interventions Stair training;DME instruction;Gait training;Balance training;Therapeutic exercise;Functional mobility training;Therapeutic activities;Patient/family education    PT Goals (Current goals can be found in the Care Plan section)  Acute Rehab PT Goals PT Goal Formulation: With patient/family Time For Goal Achievement: 09/24/21 Potential to Achieve Goals: Good    Frequency 7X/week     Co-evaluation               AM-PAC PT "6 Clicks" Mobility  Outcome Measure Help needed turning from your back to your side while in a flat bed without using bedrails?: A Little Help needed moving from lying on your back to sitting on the side of a flat bed without using bedrails?: A Little Help needed moving to and from a bed to a chair (including a wheelchair)?: A Little Help needed standing up from a chair using your arms (e.g., wheelchair or bedside chair)?: A Little Help needed to walk in hospital room?: A Little Help needed climbing 3-5 steps with a railing? : A Little 6 Click Score: 18    End of Session Equipment Utilized During Treatment: Gait belt Activity Tolerance: Other (comment) (limited by nausea) Patient left: in chair;with call bell/phone within reach;with chair alarm set;with family/visitor present Nurse Communication: Mobility status PT Visit Diagnosis: Difficulty in walking, not elsewhere classified (R26.2)    Time: 1914-7829 PT Time Calculation (min) (ACUTE ONLY): 19 min   Charges:   PT Evaluation $PT Eval Low Complexity: 1 Low         Kati PT, DPT Acute Rehabilitation Services Pager: [phone] Office: [phone]   Kati L Payson 09/20/2021, 1:16  PM

## 2021-09-20 NOTE — TOC Transition Note (Signed)
Transition of Care [facility]) - CM/SW Discharge Note  Patient Details  Name: Kristen Smith MRN: [id number] Date of Birth: 09-01-1940  [facility] of [facility]) CM/SW Contact:  Sherie Don, LCSW Phone Number: 09/20/2021, 9:27 AM  Clinical Narrative: Patient is expected to discharge home after working with PT. CSW met with patient and daughter to confirm discharge plan. Patient will go home with a home exercise program (HEP). Patient has a rolling walker at home and lives in a handicap accessible home, so there are no DME needs at this time. TOC signing off.  Final next level of care: Home/Self Care Barriers to Discharge: No Barriers Identified  Patient Goals and CMS Choice Patient states their goals for this hospitalization and ongoing recovery are:: Discharge home with HEP Choice offered to / list presented to : NA  Discharge Plan and Services        DME Arranged: N/A DME Agency: NA  Readmission Risk Interventions     View : No data to display.

## 2021-09-20 NOTE — Progress Notes (Signed)
Physical Therapy Treatment Patient Details Name: Kristen Smith MRN: [id number] DOB: [birth year] Today's Date: 09/20/2021   History of Present Illness Pt is a 81yo female presenting s/p L-THA, AA on 09/19/21. PMH: Anxiety, HTN.    PT Comments    Pt assisted from [facility] back to bed safely.  Pt with decreased cognition (RN aware).  Pt also reporting increased pain this afternoon.  Pt not able to tolerate further mobility or exercises at this time.  Pt assisted with comfortable repositioning in bed.    Recommendations for follow up therapy are one component of a multi-disciplinary discharge planning process, led by the attending physician.  Recommendations may be updated based on patient status, additional functional criteria and insurance authorization.  Follow Up Recommendations  Follow physician's recommendations for discharge plan and follow up therapies     Assistance Recommended at Discharge    Patient can return home with the following A little help with walking and/or transfers;A little help with bathing/dressing/bathroom   Equipment Recommendations  None recommended by PT    Recommendations for Other Services       Precautions / Restrictions Precautions Precautions: Fall Restrictions LLE Weight Bearing: Weight bearing as tolerated     Mobility  Bed Mobility Overal bed mobility: Needs Assistance Bed Mobility: Sit to Supine     Supine to sit: Min assist Sit to supine: Mod assist   General bed mobility comments: assist for LEs due to cognition and pain    Transfers Overall transfer level: Needs assistance Equipment used: Rolling walker (2 wheels) Transfers: Sit to/from Stand Sit to Stand: Min assist           General transfer comment: verbal cues for hand placement and positioning, assist for stability, cues to hold RW, pt on BSC on arrival and assisted back to bed    Ambulation/Gait Ambulation/Gait assistance: Min guard Gait Distance (Feet): 26  Feet Assistive device: Rolling walker (2 wheels) Gait Pattern/deviations: Step-to pattern, Decreased stance time - left, Antalgic       General Gait Details: verbal cues for sequence, step length, RW positioning; pt with nausea limiting distance   Stairs             Wheelchair Mobility    Modified Rankin (Stroke Patients Only)       Balance                                            Cognition Arousal/Alertness: Awake/alert Behavior During Therapy: WFL for tasks assessed/performed Overall Cognitive Status: Impaired/Different from baseline Area of Impairment: Orientation, Safety/judgement                 Orientation Level: Disoriented to, Place       Safety/Judgement: Decreased awareness of safety, Decreased awareness of deficits     General Comments: pt had phenergan earlier which seems to be affecting cognition per RN and daughter        Exercises      General Comments        Pertinent Vitals/Pain Pain Assessment Pain Assessment: Faces Pain Score: 4  Faces Pain Scale: Hurts whole lot Pain Location: left hip Pain Descriptors / Indicators: Aching, Sore, Guarding, Grimacing Pain Intervention(s): Repositioned, Monitored during session (RN aware)    Home Living Family/patient expects to be discharged to:: Private residence Living Arrangements: Spouse/significant other Available Help at Discharge: Family;Available 24 hours/day Type  of Home: House Home Access: Level entry       Home Layout: One level Home Equipment: Agricultural consultant (2 wheels)      Prior Function            PT Goals (current goals can now be found in the care plan section) Acute Rehab PT Goals PT Goal Formulation: With patient/family Time For Goal Achievement: 09/24/21 Potential to Achieve Goals: Good Progress towards PT goals: Not progressing toward goals - comment (pain, cognition)    Frequency    7X/week      PT Plan Current plan remains  appropriate    Co-evaluation              AM-PAC PT "6 Clicks" Mobility   Outcome Measure  Help needed turning from your back to your side while in a flat bed without using bedrails?: A Little Help needed moving from lying on your back to sitting on the side of a flat bed without using bedrails?: A Little Help needed moving to and from a bed to a chair (including a wheelchair)?: A Lot Help needed standing up from a chair using your arms (e.g., wheelchair or bedside chair)?: A Lot Help needed to walk in hospital room?: A Lot Help needed climbing 3-5 steps with a railing? : A Lot 6 Click Score: 14    End of Session Equipment Utilized During Treatment: Gait belt Activity Tolerance: Patient limited by pain Patient left: in bed;with call bell/phone within reach;with bed alarm set;with nursing/sitter in room Nurse Communication: Mobility status PT Visit Diagnosis: Difficulty in walking, not elsewhere classified (R26.2)     Time: 7564-3329 PT Time Calculation (min) (ACUTE ONLY): 14 min  Charges:  $Therapeutic Activity: 8-22 mins                     Thomasene Mohair PT, DPT Acute Rehabilitation Services Pager: [phone] Office: [phone]    Kristen Smith 09/20/2021, 2:21 PM

## 2021-09-20 NOTE — Progress Notes (Addendum)
   Subjective: 1 Day Post-Op Procedure(s) (LRB): TOTAL HIP ARTHROPLASTY ANTERIOR APPROACH (Left) Patient reports pain as mild.   Patient seen in rounds with Dr. Alvan Dame. Patient is resting in bed with her daughter at the bedside this morning. They report it was a difficult night due to nausea and pain. She was not able to participate in PT yesterday due to these issues. We will start therapy today.   Objective: Vital signs in last 24 hours: Temp:  [97.5 F (36.4 C)-98.1 F (36.7 C)] 97.8 F (36.6 C) (05/31 0607) Pulse Rate:  [71-91] 91 (05/31 0607) Resp:  [12-20] 17 (05/31 0607) BP: (105-160)/(63-95) 123/72 (05/31 0607) SpO2:  [95 %-100 %] 100 % (05/31 0607) Weight:  [55.8 kg] 55.8 kg (05/30 0952)  Intake/Output from previous day:  Intake/Output Summary (Last 24 hours) at 09/20/2021 0845 Last data filed at 09/20/2021 0643 Gross per 24 hour  Intake 2245 ml  Output 1350 ml  Net 895 ml     Intake/Output this shift: No intake/output data recorded.  Labs: Recent Labs    09/20/21 0340  HGB 10.8*   Recent Labs    09/20/21 0340  WBC 13.5*  RBC 3.42*  HCT 33.5*  PLT 301   Recent Labs    09/20/21 0340  NA 135  K 3.6  CL 101  CO2 23  BUN 10  CREATININE 0.74  GLUCOSE 171*  CALCIUM 8.6*   No results for input(s): LABPT, INR in the last 72 hours.  Exam: General - Patient is Alert and Oriented Extremity - Neurologically intact Sensation intact distally Intact pulses distally Dorsiflexion/Plantar flexion intact Dressing - dressing C/D/I Motor Function - intact, moving foot and toes well on exam.   Past Medical History:  Diagnosis Date   Anxiety    Arthritis    Dysrhythmia    Hypertension    PONV (postoperative nausea and vomiting) 2003 ??   [facility]     Assessment/Plan: 1 Day Post-Op Procedure(s) (LRB): TOTAL HIP ARTHROPLASTY ANTERIOR APPROACH (Left) Principal Problem:   S/P total hip arthroplasty Active Problems:   S/P total left hip  arthroplasty  Estimated body mass index is 21.11 kg/m as calculated from the following:   Height as of this encounter: 5\' 4"  (1.626 m).   Weight as of this encounter: 55.8 kg. Advance diet Up with therapy D/C IV fluids  DVT Prophylaxis - Aspirin Weight bearing as tolerated.  Hgb stable at 10.8 this AM.  Patient's pain was not well managed yesterday. Focus needs to be on PO meds rather than IV medication. Will d/c IV Meds. After discussion with daughter, patient does have slight tremor at baseline, will avoid tramadol along with lexapro for any concern of serotonin syndrome, although low risk. Will try PO dilaudid instead.  Plan is to go Home after hospital stay. Plan for discharge today after meeting goals with therapy. Follow up in the office in 2 weeks.   Griffith Citron, PA-C Orthopedic Surgery [phone] 09/20/2021, 8:45 AM

## 2021-09-20 NOTE — Plan of Care (Signed)
  Problem: Coping: Goal: Level of anxiety will decrease Outcome: Progressing   Problem: Pain Managment: Goal: General experience of comfort will improve Outcome: Progressing   

## 2021-09-21 DIAGNOSIS — R112 Nausea with vomiting, unspecified: Secondary | ICD-10-CM | POA: Diagnosis present

## 2021-09-21 DIAGNOSIS — M1612 Unilateral primary osteoarthritis, left hip: Secondary | ICD-10-CM | POA: Diagnosis present

## 2021-09-21 DIAGNOSIS — Z820 Family history of epilepsy and other diseases of the nervous system: Secondary | ICD-10-CM | POA: Diagnosis not present

## 2021-09-21 DIAGNOSIS — Z79899 Other long term (current) drug therapy: Secondary | ICD-10-CM | POA: Diagnosis not present

## 2021-09-21 DIAGNOSIS — I10 Essential (primary) hypertension: Secondary | ICD-10-CM | POA: Diagnosis present

## 2021-09-21 DIAGNOSIS — F419 Anxiety disorder, unspecified: Secondary | ICD-10-CM | POA: Diagnosis present

## 2021-09-21 DIAGNOSIS — E785 Hyperlipidemia, unspecified: Secondary | ICD-10-CM | POA: Diagnosis present

## 2021-09-21 DIAGNOSIS — T426X5A Adverse effect of other antiepileptic and sedative-hypnotic drugs, initial encounter: Secondary | ICD-10-CM | POA: Diagnosis present

## 2021-09-21 DIAGNOSIS — R410 Disorientation, unspecified: Secondary | ICD-10-CM | POA: Diagnosis present

## 2021-09-21 DIAGNOSIS — Z885 Allergy status to narcotic agent status: Secondary | ICD-10-CM | POA: Diagnosis not present

## 2021-09-21 LAB — CBC
HCT: 28 % — ABNORMAL LOW (ref 36.0–46.0)
Hemoglobin: 9.3 g/dL — ABNORMAL LOW (ref 12.0–15.0)
MCH: 31.6 pg (ref 26.0–34.0)
MCHC: 33.2 g/dL (ref 30.0–36.0)
MCV: 95.2 fL (ref 80.0–100.0)
Platelets: 240 10*3/uL (ref 150–400)
RBC: 2.94 MIL/uL — ABNORMAL LOW (ref 3.87–5.11)
RDW: 12.3 % (ref 11.5–15.5)
WBC: 14.1 10*3/uL — ABNORMAL HIGH (ref 4.0–10.5)
nRBC: 0 % (ref 0.0–0.2)

## 2021-09-21 MED ORDER — ENSURE ENLIVE PO LIQD
237.0000 mL | Freq: Two times a day (BID) | ORAL | Status: DC
  Administered 2021-09-22 (×2): 237 mL via ORAL

## 2021-09-21 MED ORDER — TRAMADOL HCL 50 MG PO TABS: 50.0000 mg | ORAL_TABLET | Freq: Three times a day (TID) | ORAL | 0 refills | Status: DC | PRN

## 2021-09-21 NOTE — Progress Notes (Signed)
Physical Therapy Treatment Patient Details Name: Kristen Smith MRN: [id number] DOB: [birth year] Today's Date: 09/21/2021   History of Present Illness Pt is a 81yo female presenting s/p L-THA, AA on 09/19/21. PMH: Anxiety, HTN.    PT Comments    POD # 2 pm session Daughter present during session and all day.  Reports pt is not eating and not drinking much due to nausea.  Assisted with amb to bathroom required increased time and effort as her pain was 10/10.  Assisted back to bed and positioned to comfort as well as applied ICE. Pt has NOT met her mobility goals to safely D/C to home today.    Recommendations for follow up therapy are one component of a multi-disciplinary discharge planning process, led by the attending physician.  Recommendations may be updated based on patient status, additional functional criteria and insurance authorization.  Follow Up Recommendations  Follow physician's recommendations for discharge plan and follow up therapies     Assistance Recommended at Discharge    Patient can return home with the following A little help with walking and/or transfers;A little help with bathing/dressing/bathroom   Equipment Recommendations  None recommended by PT    Recommendations for Other Services       Precautions / Restrictions Precautions Precautions: Fall Restrictions Weight Bearing Restrictions: No LLE Weight Bearing: Weight bearing as tolerated Other Position/Activity Restrictions: AxO x 3 very pleasant Lady     Mobility  Bed Mobility Overal bed mobility: Needs Assistance Bed Mobility: Sit to Supine     Supine to sit: Supervision, Min guard Sit to supine: Mod assist   General bed mobility comments: Mod Assist to support B LE back onto bed.    Transfers Overall transfer level: Needs assistance Equipment used: Rolling walker (2 wheels) Transfers: Sit to/from Stand Sit to Stand: Min guard, Supervision           General transfer comment: 25% VC's on  proper hand placement esp to avoid pulling up on walker.  Also asissted with a toilet transfer.  Daughter present and "hands on" had her asisst as well.    Ambulation/Gait Ambulation/Gait assistance: Supervision, Min guard Gait Distance (Feet): 26 Feet Assistive device: Rolling walker (2 wheels) Gait Pattern/deviations: Step-to pattern, Decreased stance time - left, Antalgic Gait velocity: decreased     General Gait Details: tolerated a slightly increased amb distance but still limited by Max c/o weakness and nausea.  Pt also reports 10/10 pain.   Stairs             Wheelchair Mobility    Modified Rankin (Stroke Patients Only)       Balance                                            Cognition Arousal/Alertness: Awake/alert Behavior During Therapy: WFL for tasks assessed/performed Overall Cognitive Status: Within Functional Limits for tasks assessed                                 General Comments: AxO x 3 but feeling poorly, not eating much due to nausea.        Exercises      General Comments        Pertinent Vitals/Pain      Home Living  Prior Function            PT Goals (current goals can now be found in the care plan section) Progress towards PT goals: Progressing toward goals    Frequency    7X/week      PT Plan Current plan remains appropriate    Co-evaluation              AM-PAC PT "6 Clicks" Mobility   Outcome Measure  Help needed turning from your back to your side while in a flat bed without using bedrails?: A Little Help needed moving from lying on your back to sitting on the side of a flat bed without using bedrails?: A Little Help needed moving to and from a bed to a chair (including a wheelchair)?: A Little Help needed standing up from a chair using your arms (e.g., wheelchair or bedside chair)?: A Lot Help needed to walk in hospital room?: A  Lot Help needed climbing 3-5 steps with a railing? : A Lot 6 Click Score: 15    End of Session Equipment Utilized During Treatment: Gait belt Activity Tolerance: Patient limited by fatigue;Other (comment) (nausea) Patient left: in bed;with bed alarm set Nurse Communication: Mobility status PT Visit Diagnosis: Difficulty in walking, not elsewhere classified (R26.2)     Time: 0871-9941 PT Time Calculation (min) (ACUTE ONLY): 26 min  Charges:  $Gait Training: 8-22 mins $Therapeutic Activity: 8-22 mins                     Rica Koyanagi  PTA Acute  Rehabilitation Services Pager      [phone] Office      [phone]

## 2021-09-21 NOTE — Plan of Care (Signed)
  Problem: Education: Goal: Knowledge of General Education information will improve Description: Including pain rating scale, medication(s)/side effects and non-pharmacologic comfort measures Outcome: Progressing   Problem: Clinical Measurements: Goal: Will remain free from infection Outcome: Progressing   Problem: Activity: Goal: Risk for activity intolerance will decrease Outcome: Progressing   Problem: Nutrition: Goal: Adequate nutrition will be maintained Outcome: Progressing   Problem: Elimination: Goal: Will not experience complications related to bowel motility Outcome: Progressing   Problem: Pain Managment: Goal: General experience of comfort will improve Outcome: Progressing   Problem: Safety: Goal: Ability to remain free from injury will improve Outcome: Progressing   

## 2021-09-21 NOTE — Plan of Care (Signed)
  Problem: Nutrition: Goal: Adequate nutrition will be maintained Outcome: Progressing   

## 2021-09-21 NOTE — Progress Notes (Signed)
   Subjective: 2 Days Post-Op Procedure(s) (LRB): TOTAL HIP ARTHROPLASTY ANTERIOR APPROACH (Left) Patient reports pain as mild.   Patient seen in rounds for Dr. Charlann Boxer. Patient is resting in bed on exam this morning with her daughter at the bedside. She reports she has not been nauseous overnight. Her daughter was concerned about the patient getting confused yesterday, eg: thinking she was at home or that the hip replacement was several weeks/months ago.  We will continue therapy today.   Objective: Vital signs in last 24 hours: Temp:  [97.5 F (36.4 C)-98 F (36.7 C)] 97.5 F (36.4 C) (06/01 0943) Pulse Rate:  [74-76] 74 (06/01 0943) Resp:  [16-17] 16 (06/01 0943) BP: (120-127)/(61-66) 127/62 (06/01 0943) SpO2:  [94 %-96 %] 95 % (06/01 0943)  Intake/Output from previous day:  Intake/Output Summary (Last 24 hours) at 09/21/2021 1209 Last data filed at 09/21/2021 0300 Gross per 24 hour  Intake 0 ml  Output 750 ml  Net -750 ml     Intake/Output this shift: No intake/output data recorded.  Labs: Recent Labs    09/20/21 0340 09/21/21 0345  HGB 10.8* 9.3*   Recent Labs    09/20/21 0340 09/21/21 0345  WBC 13.5* 14.1*  RBC 3.42* 2.94*  HCT 33.5* 28.0*  PLT 301 240   Recent Labs    09/20/21 0340  NA 135  K 3.6  CL 101  CO2 23  BUN 10  CREATININE 0.74  GLUCOSE 171*  CALCIUM 8.6*   No results for input(s): LABPT, INR in the last 72 hours.  Exam: General - Patient is Alert and Oriented Extremity - Neurologically intact Sensation intact distally Intact pulses distally Dorsiflexion/Plantar flexion intact Dressing - dressing C/D/I Motor Function - intact, moving foot and toes well on exam.   Past Medical History:  Diagnosis Date   Anxiety    Arthritis    Dysrhythmia    Hypertension    PONV (postoperative nausea and vomiting) 2003 ??   [facility]     Assessment/Plan: 2 Days Post-Op Procedure(s) (LRB): TOTAL HIP ARTHROPLASTY ANTERIOR APPROACH  (Left) Principal Problem:   S/P total hip arthroplasty Active Problems:   S/P total left hip arthroplasty  Estimated body mass index is 21.11 kg/m as calculated from the following:   Height as of this encounter: 5\' 4"  (1.626 m).   Weight as of this encounter: 55.8 kg. Advance diet Up with therapy D/C IV fluids  DVT Prophylaxis - Aspirin Weight bearing as tolerated.  Hgb stable at 9.3 this AM  Improved N/V with discontinuing dilaudid. Will plan on primarily tylenol, celebrex, robaxin and tramadol as needed for severe pain.   Plan is to go Home after hospital stay. Plan for discharge today after meeting goals with therapy. Follow up in the office in 2 weeks.   , PA-C Orthopedic Surgery [phone] 09/21/2021, 12:09 PM

## 2021-09-21 NOTE — TOC Progression Note (Signed)
Transition of Care [facility]) - Progression Note   Patient Details  Name: Kristen Smith MRN: [id number] Date of Birth: 12/17/1940  Transition of [facility] CM/SW Contact  Ewing Schlein, LCSW Phone Number: 09/21/2021, 12:12 PM  Clinical Narrative: Patient/family requesting 3N1 and are aware it will be private pay. DME order is in. Referral made to [facility] with [employer]. [employer] to deliver 3N1 to patient's room.  Barriers to Discharge: No Barriers Identified  Expected Discharge Plan and Services In-house Referral: Clinical Social Work Post Acute Care Choice: Durable Medical Equipment Expected Discharge Date: 09/20/21               DME Arranged: 3-N-1 DME Agency: [employer] Date DME Agency Contacted: 09/21/21 Time DME Agency Contacted: 1210 Representative spoke with at DME Agency: Danielle  Readmission Risk Interventions     View : No data to display.

## 2021-09-21 NOTE — Progress Notes (Signed)
Physical Therapy Treatment Patient Details Name: Kristen Smith MRN: [id number] DOB: [birth year] Today's Date: 09/21/2021   History of Present Illness Pt is a 81yo female presenting s/p L-THA, AA on 09/19/21. PMH: Anxiety, HTN.    PT Comments    POD # 2 am session Pt not progressing with issues of nausea and feeling "weak".  Assisted OOB required increased time.  General transfer comment: 25% VC's on proper hand placement esp to avoid pulling up on walker.  Also asissted with a toilet transfer.  Daughter present and "hands on" had her asisst as well. General Gait Details: limited amb distance to and from bathroom due to increased c/o nausea with activty. Assisted to recliner and performed a few TE's followed by ICE. Pt will need another PT session to increase her amb distance and monitor her nausea.     Recommendations for follow up therapy are one component of a multi-disciplinary discharge planning process, led by the attending physician.  Recommendations may be updated based on patient status, additional functional criteria and insurance authorization.  Follow Up Recommendations  Follow physician's recommendations for discharge plan and follow up therapies     Assistance Recommended at Discharge    Patient can return home with the following A little help with walking and/or transfers;A little help with bathing/dressing/bathroom   Equipment Recommendations  None recommended by PT    Recommendations for Other Services       Precautions / Restrictions Precautions Precautions: Fall Restrictions Weight Bearing Restrictions: No LLE Weight Bearing: Weight bearing as tolerated Other Position/Activity Restrictions: AxO x 3 very pleasant Lady     Mobility  Bed Mobility Overal bed mobility: Needs Assistance Bed Mobility: Supine to Sit     Supine to sit: Supervision, Min guard     General bed mobility comments: self able with increased time and effort.    Transfers Overall  transfer level: Needs assistance Equipment used: Rolling walker (2 wheels) Transfers: Sit to/from Stand Sit to Stand: Min guard, Supervision           General transfer comment: 25% VC's on proper hand placement esp to avoid pulling up on walker.  Also asissted with a toilet transfer.  Daughter present and "hands on" had her asisst as well.    Ambulation/Gait Ambulation/Gait assistance: Supervision, Min guard Gait Distance (Feet): 22 Feet (11 feet x 2 to and from bathroom) Assistive device: Rolling walker (2 wheels) Gait Pattern/deviations: Step-to pattern, Decreased stance time - left, Antalgic Gait velocity: decreased     General Gait Details: limited amb distance to and from bathroom due to increased c/o nausea with activty.   Stairs             Wheelchair Mobility    Modified Rankin (Stroke Patients Only)       Balance                                            Cognition Arousal/Alertness: Awake/alert Behavior During Therapy: WFL for tasks assessed/performed Overall Cognitive Status: Within Functional Limits for tasks assessed                                 General Comments: AxO x 3 but feeling poorly, not eating much due to nausea.        Exercises  Total Hip  Replacement TE's following HEP Handout 10 reps ankle pumps 05 reps knee presses 05 reps heel slides  Instructed how to use a belt loop to assist  Followed by ICE     General Comments        Pertinent Vitals/Pain      Home Living                          Prior Function            PT Goals (current goals can now be found in the care plan section) Progress towards PT goals: Progressing toward goals    Frequency    7X/week      PT Plan Current plan remains appropriate    Co-evaluation              AM-PAC PT "6 Clicks" Mobility   Outcome Measure  Help needed turning from your back to your side while in a flat bed without  using bedrails?: A Little Help needed moving from lying on your back to sitting on the side of a flat bed without using bedrails?: A Little Help needed moving to and from a bed to a chair (including a wheelchair)?: A Little Help needed standing up from a chair using your arms (e.g., wheelchair or bedside chair)?: A Lot Help needed to walk in hospital room?: A Lot Help needed climbing 3-5 steps with a railing? : A Lot 6 Click Score: 15    End of Session Equipment Utilized During Treatment: Gait belt Activity Tolerance: Patient limited by fatigue;Other (comment) (nausea) Patient left: in chair;with call bell/phone within reach;with chair alarm set;with family/visitor present Nurse Communication: Mobility status PT Visit Diagnosis: Difficulty in walking, not elsewhere classified (R26.2)     Time: 6433-2951 PT Time Calculation (min) (ACUTE ONLY): 26 min  Charges:  $Gait Training: 8-22 mins $Therapeutic Activity: 8-22 mins                     {Plumer Mittelstaedt  PTA Acute  Rehabilitation Services Pager      [phone] Office      [phone]

## 2021-09-22 MED ORDER — ACETAMINOPHEN 500 MG PO TABS
1000.0000 mg | ORAL_TABLET | Freq: Four times a day (QID) | ORAL | Status: DC
Start: 2021-09-22 — End: 2021-09-22
  Administered 2021-09-22 (×2): 1000 mg via ORAL
  Filled 2021-09-22 (×2): qty 2

## 2021-09-22 MED ORDER — MECLIZINE HCL 25 MG PO TABS
25.0000 mg | ORAL_TABLET | Freq: Once | ORAL | Status: AC
  Administered 2021-09-22: 25 mg via ORAL
  Filled 2021-09-22: qty 1

## 2021-09-22 NOTE — Plan of Care (Signed)
  Problem: Education: Goal: Knowledge of General Education information will improve Description Including pain rating scale, medication(s)/side effects and non-pharmacologic comfort measures Outcome: Progressing   Problem: Health Behavior/Discharge Planning: Goal: Ability to manage health-related needs will improve Outcome: Progressing   Problem: Clinical Measurements: Goal: Ability to maintain clinical measurements within normal limits will improve Outcome: Progressing   Problem: Activity: Goal: Risk for activity intolerance will decrease Outcome: Progressing   Problem: Nutrition: Goal: Adequate nutrition will be maintained Outcome: Progressing   Problem: Coping: Goal: Level of anxiety will decrease Outcome: Progressing   Problem: Elimination: Goal: Will not experience complications related to bowel motility Outcome: Progressing Goal: Will not experience complications related to urinary retention Outcome: Progressing   Problem: Pain Managment: Goal: General experience of comfort will improve Outcome: Progressing   Problem: Safety: Goal: Ability to remain free from injury will improve Outcome: Progressing   

## 2021-09-22 NOTE — Progress Notes (Signed)
   Subjective: 3 Days Post-Op Procedure(s) (LRB): TOTAL HIP ARTHROPLASTY ANTERIOR APPROACH (Left) Patient reports pain as mild.   Patient seen in rounds for Dr. Alvan Dame. Patient is resting in bed with her granddaughter at the bedside. They also called in her daughter. She tells me she is feeling better today than yesterday, but still has had difficulty with nausea.  We will continue therapy today.   Objective: Vital signs in last 24 hours: Temp:  [97.5 F (36.4 C)-98.6 F (37 C)] 97.6 F (36.4 C) (06/02 0340) Pulse Rate:  [66-78] 66 (06/02 0340) Resp:  [15-17] 17 (06/02 0340) BP: (127-146)/(62-81) 143/75 (06/02 0340) SpO2:  [93 %-99 %] 99 % (06/02 0340)  Intake/Output from previous day:  Intake/Output Summary (Last 24 hours) at 09/22/2021 0816 Last data filed at 09/21/2021 2006 Gross per 24 hour  Intake --  Output 350 ml  Net -350 ml     Intake/Output this shift: No intake/output data recorded.  Labs: Recent Labs    09/20/21 0340 09/21/21 0345  HGB 10.8* 9.3*   Recent Labs    09/20/21 0340 09/21/21 0345  WBC 13.5* 14.1*  RBC 3.42* 2.94*  HCT 33.5* 28.0*  PLT 301 240   Recent Labs    09/20/21 0340  NA 135  K 3.6  CL 101  CO2 23  BUN 10  CREATININE 0.74  GLUCOSE 171*  CALCIUM 8.6*   No results for input(s): LABPT, INR in the last 72 hours.  Exam: General - Patient is Alert and Oriented Extremity - Neurologically intact Sensation intact distally Intact pulses distally Dorsiflexion/Plantar flexion intact Dressing - dressing C/D/I Motor Function - intact, moving foot and toes well on exam.   Past Medical History:  Diagnosis Date   Anxiety    Arthritis    Dysrhythmia    Hypertension    PONV (postoperative nausea and vomiting) 2003 ??   [facility]     Assessment/Plan: 3 Days Post-Op Procedure(s) (LRB): TOTAL HIP ARTHROPLASTY ANTERIOR APPROACH (Left) Principal Problem:   S/P total hip arthroplasty Active Problems:   S/P total left hip  arthroplasty  Estimated body mass index is 21.11 kg/m as calculated from the following:   Height as of this encounter: 5\' 4"  (1.626 m).   Weight as of this encounter: 55.8 kg. Advance diet Up with therapy D/C IV fluids  DVT Prophylaxis - Aspirin Weight bearing as tolerated.  Will completely d/c tramadol. After discussion with family, I do not feel the benefit is worth the trade off of potential nausea.   She has not had a bowel movement, but has not been eating much or moving much. We will d/c with BID miralax. She knows to call at any point if she has continued constipation.  Plan is to go Home after hospital stay. Plan for discharge today after meeting goals with therapy. Follow up in the office in 2 weeks.   Griffith Citron, PA-C Orthopedic Surgery [phone] 09/22/2021, 8:16 AM

## 2021-09-22 NOTE — Progress Notes (Addendum)
Discharge medications and instructions given to patient and family. All questions and concerns answered. Patient medicated for pain and nausea before departure, tolerated well. IV removed per protocol, skin intact. All belongings given to patient. Vital signs stable.

## 2021-09-22 NOTE — Progress Notes (Signed)
Physical Therapy Treatment Patient Details Name: Kristen Smith MRN: [id number] DOB: 04/10/1941 Today's Date: 09/22/2021   History of Present Illness Pt is a 81yo female presenting s/p L-THA, AA on 09/19/21. PMH: Anxiety, HTN.    PT Comments    POD # 3 am session Granddaughter and [address] present during session.  Assisted OOB to amb to bathroom while Educating Family on use of safety belt and safe handling.  Educated on proper tech toilet transfer and assist level.  Then assisted with amb in hallway when pt started to c/o nausea.  Vital WNL.  Face flushed.  Assisted to recliner.  Explained to family her nausea seems to be triggered by exertion.  Educated on "pacing" activity and allowing rest breaks.  Will see pt again later today.    Recommendations for follow up therapy are one component of a multi-disciplinary discharge planning process, led by the attending physician.  Recommendations may be updated based on patient status, additional functional criteria and insurance authorization.  Follow Up Recommendations  Follow physician's recommendations for discharge plan and follow up therapies     Assistance Recommended at Discharge    Patient can return home with the following A little help with walking and/or transfers;A little help with bathing/dressing/bathroom   Equipment Recommendations  None recommended by PT    Recommendations for Other Services       Precautions / Restrictions Precautions Precautions: Fall Restrictions Weight Bearing Restrictions: No LLE Weight Bearing: Weight bearing as tolerated Other Position/Activity Restrictions: AxO x 3 very pleasant Lady but having issues of nausea after activity     Mobility  Bed Mobility Overal bed mobility: Needs Assistance Bed Mobility: Supine to Sit     Supine to sit: Supervision     General bed mobility comments: self able with increased time using belt    Transfers Overall transfer level: Needs  assistance Equipment used: Rolling walker (2 wheels) Transfers: Sit to/from Stand Sit to Stand: Supervision, Min guard           General transfer comment: 25% VC's on proper hand placement esp to avoid pulling up on walker.  Also asissted with a toilet transfer.  Daughter present and "hands on" had her asisst as well.    Ambulation/Gait Ambulation/Gait assistance: Supervision, Min guard Gait Distance (Feet): 18 Feet Assistive device: Rolling walker (2 wheels) Gait Pattern/deviations: Step-to pattern, Decreased stance time - left, Antalgic Gait velocity: decreased     General Gait Details: tolerated a slightly increased amb distance but still limited by Max c/o weakness and nausea.  Pain controlled at 5/10.   Stairs Stairs:  (no stairs to enter home)           Wheelchair Mobility    Modified Rankin (Stroke Patients Only)       Balance                                            Cognition                                                Exercises      General Comments        Pertinent Vitals/Pain Pain Assessment Pain Assessment: 0-10 Pain Score: 5  Pain Location: left  hip Pain Descriptors / Indicators: Aching, Sore, Guarding, Grimacing Pain Intervention(s): Monitored during session, Premedicated before session, Repositioned, Ice applied    Home Living                          Prior Function            PT Goals (current goals can now be found in the care plan section) Progress towards PT goals: Progressing toward goals    Frequency    7X/week      PT Plan Current plan remains appropriate    Co-evaluation              AM-PAC PT "6 Clicks" Mobility   Outcome Measure  Help needed turning from your back to your side while in a flat bed without using bedrails?: A Little Help needed moving from lying on your back to sitting on the side of a flat bed without using bedrails?: A Little Help  needed moving to and from a bed to a chair (including a wheelchair)?: A Little Help needed standing up from a chair using your arms (e.g., wheelchair or bedside chair)?: A Little Help needed to walk in hospital room?: A Little Help needed climbing 3-5 steps with a railing? : A Little 6 Click Score: 18    End of Session Equipment Utilized During Treatment: Gait belt Activity Tolerance: Patient limited by fatigue;Other (comment) (nausea) Patient left: in chair;with call bell/phone within reach;with family/visitor present Nurse Communication: Mobility status PT Visit Diagnosis: Difficulty in walking, not elsewhere classified (R26.2)     Time: 5974-1638 PT Time Calculation (min) (ACUTE ONLY): 29 min  Charges:  $Gait Training: 8-22 mins $Therapeutic Activity: 8-22 mins                    Felecia Shelling  PTA Acute  Rehabilitation Services Pager      [phone] Office      [phone]

## 2021-09-22 NOTE — Progress Notes (Signed)
Physical Therapy Treatment Patient Details Name: Kristen Smith MRN: [id number] DOB: 1941/02/02 Today's Date: 09/22/2021   History of Present Illness Pt is a 81yo female presenting s/p L-THA, AA on 09/19/21. PMH: Anxiety, HTN.    PT Comments    POD # 3 pm session Pt amb out of bathroom with NT and family.  Assisted with amb a further distance in hallway.  General Gait Details: tolerated an increased distance but still with c/o nausea with increased activity.  Vitals all WNL.  Educated on "pacing" herself.  Pain controlled at 4/10.  Granddaughter and Randie Heinz Granddaughter also assisting. Pt wants to go home today and will have 24/7 family support.  Educated on activity level with slow increase.  Addressed all mobility questions, discussed appropriate activity, educated on use of ICE.  Pt ready for D/C to home.   Recommendations for follow up therapy are one component of a multi-disciplinary discharge planning process, led by the attending physician.  Recommendations may be updated based on patient status, additional functional criteria and insurance authorization.  Follow Up Recommendations  Follow physician's recommendations for discharge plan and follow up therapies     Assistance Recommended at Discharge    Patient can return home with the following A little help with walking and/or transfers;A little help with bathing/dressing/bathroom   Equipment Recommendations  None recommended by PT    Recommendations for Other Services       Precautions / Restrictions Precautions Precautions: Fall Restrictions Weight Bearing Restrictions: No LLE Weight Bearing: Weight bearing as tolerated Other Position/Activity Restrictions: AxO x 3 very pleasant Lady but having issues of nausea after activity     Mobility  Bed Mobility Overal bed mobility: Needs Assistance Bed Mobility: Supine to Sit     Supine to sit: Supervision     General bed mobility comments: Pt OOB on her way back from  bathroom with NT and family.    Transfers Overall transfer level: Needs assistance Equipment used: Rolling walker (2 wheels) Transfers: Sit to/from Stand Sit to Stand: Supervision, Min guard           General transfer comment: VC's on safety with turns and proper walker to self distance with turns.    Ambulation/Gait Ambulation/Gait assistance: Supervision, Min guard Gait Distance (Feet): 45 Feet Assistive device: Rolling walker (2 wheels) Gait Pattern/deviations: Step-to pattern, Decreased stance time - left, Antalgic Gait velocity: decreased     General Gait Details: tolerated an increased distance but still with c/o nausea with increased activity.  Vitals all WNL.  Educated on "pacing" herself.  Pain controlled at 4/10.  Granddaughter and Randie Heinz Granddaughter also assisting.   Stairs Stairs:  (no stairs to enter home)           Wheelchair Mobility    Modified Rankin (Stroke Patients Only)       Balance                                            Cognition                                                Exercises      General Comments        Pertinent Vitals/Pain Pain Assessment Pain Assessment:  0-10 Pain Score: 5  Pain Location: left hip Pain Descriptors / Indicators: Aching, Sore, Guarding, Grimacing Pain Intervention(s): Monitored during session, Premedicated before session, Repositioned, Ice applied    Home Living                          Prior Function            PT Goals (current goals can now be found in the care plan section) Progress towards PT goals: Progressing toward goals    Frequency    7X/week      PT Plan Current plan remains appropriate    Co-evaluation              AM-PAC PT "6 Clicks" Mobility   Outcome Measure  Help needed turning from your back to your side while in a flat bed without using bedrails?: A Little Help needed moving from lying on your back  to sitting on the side of a flat bed without using bedrails?: A Little Help needed moving to and from a bed to a chair (including a wheelchair)?: A Little Help needed standing up from a chair using your arms (e.g., wheelchair or bedside chair)?: A Little Help needed to walk in hospital room?: A Little Help needed climbing 3-5 steps with a railing? : A Little 6 Click Score: 18    End of Session Equipment Utilized During Treatment: Gait belt Activity Tolerance: Patient limited by fatigue;Other (comment) (nausea) Patient left: in chair;with call bell/phone within reach;with family/visitor present Nurse Communication: Mobility status PT Visit Diagnosis: Difficulty in walking, not elsewhere classified (R26.2)     Time: 1430-1455 PT Time Calculation (min) (ACUTE ONLY): 25 min  Charges:  $Gait Training: 8-22 mins $Therapeutic Activity: 8-22 mins                     Felecia Shelling  PTA Acute  Rehabilitation Services Pager      [phone] Office      [phone]

## 2021-10-02 NOTE — Discharge Summary (Signed)
Physician Discharge Summary   Patient ID: Kristen Smith MRN: [id number] DOB/AGE: 1940-08-15 81 y.o.  Admit date: 09/19/2021 Discharge date: 09/22/2021  Primary Diagnosis: Left  hip osteoarthritis.   Admission Diagnoses:  Past Medical History:  Diagnosis Date   Anxiety    Arthritis    Dysrhythmia    Hypertension    PONV (postoperative nausea and vomiting) 2003 ??   [facility]    Discharge Diagnoses:   Principal Problem:   S/P total hip arthroplasty Active Problems:   S/P total left hip arthroplasty  Estimated body mass index is 21.11 kg/m as calculated from the following:   Height as of this encounter:  (1.626 m).   Weight as of this encounter: 55.8 kg.  Procedure:  Procedure(s) (LRB): TOTAL HIP ARTHROPLASTY ANTERIOR APPROACH (Left)   Consults: None  HPI:  Kristen Smith is a 81 y.o. female who had   presented to office for evaluation of left hip pain.  Radiographs revealed   progressive degenerative changes with bone-on-bone   articulation of the  hip joint, including subchondral cystic changes and osteophytes.  The patient had painful limited range of   motion significantly affecting their overall quality of life and function.  The patient was failing to    respond to conservative measures including medications and/or injections and activity modification and at this point was ready   to proceed with more definitive measures.  Consent was obtained for   benefit of pain relief.  Specific risks of infection, DVT, component   failure, dislocation, neurovascular injury, and need for revision surgery were reviewed in the office.  Laboratory Data: Admission on 09/19/2021, Discharged on 09/22/2021  Component Date Value Ref Range Status   ABO/RH(D) 09/19/2021    Final                   Value:A POS Performed at [facility], [address] [place], [place] [address]    WBC 09/20/2021 13.5 (H)  4.0 - 10.5 K/uL Final   RBC 09/20/2021 3.42 (L)  3.87 -  5.11 MIL/uL Final   Hemoglobin 09/20/2021 10.8 (L)  12.0 - 15.0 g/dL Final   HCT 19/14/7829 33.5 (L)  36.0 - 46.0 % Final   MCV 09/20/2021 98.0  80.0 - 100.0 fL Final   MCH 09/20/2021 31.6  26.0 - 34.0 pg Final   MCHC 09/20/2021 32.2  30.0 - 36.0 g/dL Final   RDW 56/21/3086 12.8  11.5 - 15.5 % Final   Platelets 09/20/2021 301  150 - 400 K/uL Final   nRBC 09/20/2021 0.0  0.0 - 0.2 % Final   Performed at [facility], [address] [place], [place] [address]   Sodium 09/20/2021 135  135 - 145 mmol/L Final   Potassium 09/20/2021 3.6  3.5 - 5.1 mmol/L Final   Chloride 09/20/2021 101  98 - 111 mmol/L Final   CO2 09/20/2021 23  22 - 32 mmol/L Final   Glucose, Bld 09/20/2021 171 (H)  70 - 99 mg/dL Final   Glucose reference range applies only to samples taken after fasting for at least 8 hours.   BUN 09/20/2021 10  8 - 23 mg/dL Final   Creatinine, Ser 09/20/2021 0.74  0.44 - 1.00 mg/dL Final   Calcium 96/29/5284 8.6 (L)  8.9 - 10.3 mg/dL Final   GFR, Estimated 09/20/2021 >60  >60 mL/min Final   Comment: (NOTE) Calculated using the CKD-EPI Creatinine Equation (2021)  Anion gap 09/20/2021 11  5 - 15 Final   Performed at [facility], [address], [place], [place] [address]   WBC 09/21/2021 14.1 (H)  4.0 - 10.5 K/uL Final   RBC 09/21/2021 2.94 (L)  3.87 - 5.11 MIL/uL Final   Hemoglobin 09/21/2021 9.3 (L)  12.0 - 15.0 g/dL Final   HCT 60/45/4098 28.0 (L)  36.0 - 46.0 % Final   MCV 09/21/2021 95.2  80.0 - 100.0 fL Final   MCH 09/21/2021 31.6  26.0 - 34.0 pg Final   MCHC 09/21/2021 33.2  30.0 - 36.0 g/dL Final   RDW 11/91/4782 12.3  11.5 - 15.5 % Final   Platelets 09/21/2021 240  150 - 400 K/uL Final   nRBC 09/21/2021 0.0  0.0 - 0.2 % Final   Performed at [facility], [address], [place], [place] [address]  Hospital Outpatient Visit on 09/11/2021  Component Date Value Ref Range Status   MRSA, PCR 09/11/2021 NEGATIVE   NEGATIVE Final   Staphylococcus aureus 09/11/2021 NEGATIVE  NEGATIVE Final   Comment: (NOTE) The Xpert SA Assay (FDA approved for NASAL specimens in patients 51 years of age and older), is one component of a comprehensive surveillance program. It is not intended to diagnose infection nor to guide or monitor treatment. Performed at [facility], [address] [place], [place] [address]    Sodium 09/11/2021 134 (L)  135 - 145 mmol/L Final   Potassium 09/11/2021 4.1  3.5 - 5.1 mmol/L Final   Chloride 09/11/2021 100  98 - 111 mmol/L Final   CO2 09/11/2021 24  22 - 32 mmol/L Final   Glucose, Bld 09/11/2021 96  70 - 99 mg/dL Final   Glucose reference range applies only to samples taken after fasting for at least 8 hours.   BUN 09/11/2021 13  8 - 23 mg/dL Final   Creatinine, Ser 09/11/2021 0.75  0.44 - 1.00 mg/dL Final   Calcium 78/46/9629 9.6  8.9 - 10.3 mg/dL Final   GFR, Estimated 09/11/2021 >60  >60 mL/min Final   Comment: (NOTE) Calculated using the CKD-EPI Creatinine Equation (2021)    Anion gap 09/11/2021 10  5 - 15 Final   Performed at [facility], [address] [place], [place] [address]   WBC 09/11/2021 6.5  4.0 - 10.5 K/uL Final   RBC 09/11/2021 3.98  3.87 - 5.11 MIL/uL Final   Hemoglobin 09/11/2021 13.1  12.0 - 15.0 g/dL Final   HCT 32/44/0102 37.8  36.0 - 46.0 % Final   MCV 09/11/2021 95.0  80.0 - 100.0 fL Final   MCH 09/11/2021 32.9  26.0 - 34.0 pg Final   MCHC 09/11/2021 34.7  30.0 - 36.0 g/dL Final   RDW 72/53/6644 12.7  11.5 - 15.5 % Final   Platelets 09/11/2021 322  150 - 400 K/uL Final   nRBC 09/11/2021 0.0  0.0 - 0.2 % Final   Performed at [facility], [address], [place], [place] [address]   ABO/RH(D) 09/11/2021 A POS   Final   Antibody Screen 09/11/2021 NEG   Final   Sample Expiration 09/11/2021 09/22/2021,2359   Final   Extend sample reason 09/11/2021    Final                   Value:NO  TRANSFUSIONS OR PREGNANCY IN THE PAST 3 MONTHS Performed at [facility], [address] [place], [place] [address]  Appointment on 09/07/2021  Component Date Value Ref Range Status   S' Lateral 09/07/2021 2.49  cm Final   AV Area VTI 09/07/2021 2.50  cm2 Final   AV Mean grad 09/07/2021 4.0  mmHg Final   Single Plane A4C EF 09/07/2021 62.8  % Final   Single Plane A2C EF 09/07/2021 52.6  % Final   Calc EF 09/07/2021 59.1  % Final   AV Area mean vel 09/07/2021 2.55  cm2 Final   Area-P 1/2 09/07/2021 4.15  cm2 Final   AR max vel 09/07/2021 3.01  cm2 Final   AV Peak grad 09/07/2021 6.7  mmHg Final   Ao pk vel 09/07/2021 1.29  m/s Final     X-Rays:DG HIP UNILAT WITH PELVIS 1V LEFT  Result Date: 09/19/2021 CLINICAL DATA:  Fluoroscopic assistance for left hip arthroplasty EXAM: DG HIP (WITH OR WITHOUT PELVIS) 1V*L* COMPARISON:  09/12/2020 FINDINGS: Fluoroscopic images show left hip arthroplasty. Fluoroscopic time was 7 seconds. Radiation dose 1.01 mGy. IMPRESSION: Fluoroscopic assistance was provided for left hip arthroplasty. Electronically Signed   By: Ernie AvenaPalani  Rathinasamy M.D.   On: 09/19/2021 15:43   DG C-Arm 1-60 Min-No Report  Result Date: 09/19/2021 Fluoroscopy was utilized by the requesting physician.  No radiographic interpretation.   DG Pelvis Portable  Result Date: 09/19/2021 CLINICAL DATA:  Left hip arthroplasty EXAM: PORTABLE PELVIS 1-2 VIEWS COMPARISON:  09/12/2020 FINDINGS: Interval postsurgical changes from left total hip arthroplasty. Arthroplasty components are in their expected alignment. No periprosthetic fracture or evidence of other complication. Expected postoperative changes within the overlying soft tissues. IMPRESSION: Satisfactory postoperative appearance of left total hip arthroplasty. Electronically Signed   By: Duanne GuessNicholas  Plundo D.O.   On: 09/19/2021 15:09   ECHOCARDIOGRAM COMPLETE  Result Date: 09/07/2021    ECHOCARDIOGRAM REPORT   Patient  Name:   Kristen Smith Date of Exam: 09/07/2021 Medical Rec #:  [id number]    Height:       63.0 in Accession #:    [id number]   Weight:       138.0 lb Date of Birth:  [birth year]    BSA:          1.652 m Patient Age:    [age] years     BP:           116/80 mmHg Patient Gender: F            HR:           74 bpm. Exam Location:  Outpatient Procedure: 2D Echo, Cardiac Doppler, Color Doppler and Strain Analysis Indications:    Z01.818 Encounter for other preprocedural examination; R94.31                 Abnormal EKG  History:        Patient has no prior history of Echocardiogram examinations.                 Abnormal ECG; Risk Factors:Non-Smoker, Hypertension and                 Dyslipidemia. Patient denies chest pain, SOB and leg edema.                 Patient scheduled for left hip surgery 09/19/2021.  Sonographer:    Carlos AmericanAlecia Mackin RVT, RDCS (AE), RDMS Referring Phys: [id number] JONATHAN J BERRY  Sonographer Comments: Image acquisition challenging due to respiratory motion. IMPRESSIONS  1. Left ventricular ejection fraction, by estimation, is 60 to 65%. The left ventricle has  normal function. The left ventricle has no regional wall motion abnormalities. Left ventricular diastolic parameters are consistent with Grade I diastolic dysfunction (impaired relaxation). Elevated left atrial pressure. The average left ventricular global longitudinal strain is -17.4 %. The global longitudinal strain is normal.  2. Right ventricular systolic function is normal. The right ventricular size is normal.  3. Left atrial size was mildly dilated.  4. The mitral valve is normal in structure. Trivial mitral valve regurgitation. No evidence of mitral stenosis.  5. The aortic valve is tricuspid. Aortic valve regurgitation is not visualized. Aortic valve sclerosis is present, with no evidence of aortic valve stenosis.  6. The inferior vena cava is normal in size with greater than 50% respiratory variability, suggesting right atrial pressure of 3 mmHg.  Comparison(s): No prior Echocardiogram. FINDINGS  Left Ventricle: Left ventricular ejection fraction, by estimation, is 60 to 65%. The left ventricle has normal function. The left ventricle has no regional wall motion abnormalities. The average left ventricular global longitudinal strain is -17.4 %. The global longitudinal strain is normal. The left ventricular internal cavity size was normal in size. There is no left ventricular hypertrophy. Left ventricular diastolic parameters are consistent with Grade I diastolic dysfunction (impaired relaxation). Elevated left atrial pressure. Right Ventricle: The right ventricular size is normal. Right ventricular systolic function is normal. Left Atrium: Left atrial size was mildly dilated. Right Atrium: Right atrial size was normal in size. Pericardium: There is no evidence of pericardial effusion. Mitral Valve: The mitral valve is normal in structure. Trivial mitral valve regurgitation. No evidence of mitral valve stenosis. Tricuspid Valve: The tricuspid valve is normal in structure. Tricuspid valve regurgitation is trivial. No evidence of tricuspid stenosis. Aortic Valve: The aortic valve is tricuspid. Aortic valve regurgitation is not visualized. Aortic valve sclerosis is present, with no evidence of aortic valve stenosis. Aortic valve mean gradient measures 4.0 mmHg. Aortic valve peak gradient measures 6.7  mmHg. Aortic valve area, by VTI measures 2.50 cm. Pulmonic Valve: The pulmonic valve was normal in structure. Pulmonic valve regurgitation is not visualized. No evidence of pulmonic stenosis. Aorta: The aortic root is normal in size and structure. Venous: The inferior vena cava is normal in size with greater than 50% respiratory variability, suggesting right atrial pressure of 3 mmHg. IAS/Shunts: No atrial level shunt detected by color flow Doppler.  LEFT VENTRICLE PLAX 2D LVIDd:         3.50 cm     Diastology LVIDs:         2.49 cm     LV e' medial:    5.87 cm/s LV  PW:         0.96 cm     LV E/e' medial:  14.7 LV IVS:        1.09 cm     LV e' lateral:   6.85 cm/s LVOT diam:     2.30 cm     LV E/e' lateral: 12.6 LV SV:         72 LV SV Index:   44          2D Longitudinal Strain LVOT Area:     4.15 cm    2D Strain GLS Avg:     -17.4 %  LV Volumes (MOD) LV vol d, MOD A2C: 68.1 ml 3D Volume EF: LV vol d, MOD A4C: 64.3 ml 3D EF:        60 % LV vol s, MOD A2C: 32.3 ml LV EDV:  93 ml LV vol s, MOD A4C: 23.9 ml LV ESV:       37 ml LV SV MOD A2C:     35.8 ml LV SV:        56 ml LV SV MOD A4C:     64.3 ml LV SV MOD BP:      40.4 ml RIGHT VENTRICLE RV S prime:     10.90 cm/s TAPSE (M-mode): 2.2 cm LEFT ATRIUM             Index        RIGHT ATRIUM           Index LA diam:        4.00 cm 2.42 cm/m   RA Area:     14.00 cm LA Vol (A2C):   72.8 ml 44.08 ml/m  RA Volume:   34.70 ml  21.01 ml/m LA Vol (A4C):   79.6 ml 48.20 ml/m LA Biplane Vol: 78.4 ml 47.47 ml/m  AORTIC VALVE                    PULMONIC VALVE AV Area (Vmax):    3.01 cm     PV Vmax:       0.88 m/s AV Area (Vmean):   2.55 cm     PV Peak grad:  3.1 mmHg AV Area (VTI):     2.50 cm AV Vmax:           129.00 cm/s AV Vmean:          89.300 cm/s AV VTI:            0.289 m AV Peak Grad:      6.7 mmHg AV Mean Grad:      4.0 mmHg LVOT Vmax:         93.40 cm/s LVOT Vmean:        54.800 cm/s LVOT VTI:          0.174 m LVOT/AV VTI ratio: 0.60  AORTA Ao Root diam: 3.10 cm Ao Asc diam:  3.30 cm Ao Arch diam: 2.5 cm MITRAL VALVE                TRICUSPID VALVE MV Area (PHT): 4.15 cm     TR Peak grad:   22.7 mmHg MV Decel Time: 183 msec     TR Vmax:        238.00 cm/s MV E velocity: 86.50 cm/s MV A velocity: 111.00 cm/s  SHUNTS MV E/A ratio:  0.78         Systemic VTI:  0.17 m                             Systemic Diam: 2.30 cm Olga Millers MD Electronically signed by Olga Millers MD Signature Date/Time: 09/07/2021/4:17:24 PM    Final     EKG: Orders placed or performed in visit on 09/05/21   EKG 12-Lead     Hospital  Course: Kristen Smith is a 81 y.o. who was admitted to [facility]. They were brought to the operating room on 09/19/2021 and underwent Procedure(s): TOTAL HIP ARTHROPLASTY ANTERIOR APPROACH.  Patient tolerated the procedure well and was later transferred to the recovery room and then to the orthopaedic floor for postoperative care. They were given PO and IV analgesics for pain control following their surgery. They were given 24 hours of postoperative antibiotics of  Anti-infectives (From admission, onward)  Start     Dose/Rate Route Frequency Ordered Stop   09/19/21 1815  ceFAZolin (ANCEF) IVPB 2g/100 mL premix        2 g 200 mL/hr over 30 Minutes Intravenous Every 6 hours 09/19/21 1428 09/20/21 0007   09/19/21 0945  ceFAZolin (ANCEF) IVPB 2g/100 mL premix        2 g 200 mL/hr over 30 Minutes Intravenous On call to O.R. 09/19/21 1610 09/19/21 1212      and started on DVT prophylaxis in the form of Aspirin.   PT and OT were ordered for total joint protocol. Discharge planning consulted to help with postop disposition and equipment needs. Patient had a fair night on the evening of surgery. She had difficulty with nausea/vomiting. They started to get up OOB with therapy on POD #1 and #2 but continued to have nausea.   Pt was seen during rounds on day three and we decided to discontinue pain medications. She was ready to go home pending progress with therapy. Pt worked with therapy for two additional sessions and was meeting their goals. She was discharged to home later that day in stable condition.  Diet: Regular diet Activity: WBAT Follow-up: in 2 weeks Disposition: Home Discharged Condition: good   Discharge Instructions     Call MD / Call 911   Complete by: As directed    If you experience chest pain or shortness of breath, CALL 911 and be transported to the hospital emergency room.  If you develope a fever above 101 F, pus (white drainage) or increased drainage or redness at  the wound, or calf pain, call your surgeon's office.   Call MD / Call 911   Complete by: As directed    If you experience chest pain or shortness of breath, CALL 911 and be transported to the hospital emergency room.  If you develope a fever above 101 F, pus (white drainage) or increased drainage or redness at the wound, or calf pain, call your surgeon's office.   Change dressing   Complete by: As directed    Maintain surgical dressing until follow up in the clinic. If the edges start to pull up, may reinforce with tape. If the dressing is no longer working, may remove and cover with gauze and tape, but must keep the area dry and clean.  Call with any questions or concerns.   Change dressing   Complete by: As directed    Maintain surgical dressing until follow up in the clinic. If the edges start to pull up, may reinforce with tape. If the dressing is no longer working, may remove and cover with gauze and tape, but must keep the area dry and clean.  Call with any questions or concerns.   Constipation Prevention   Complete by: As directed    Drink plenty of fluids.  Prune juice may be helpful.  You may use a stool softener, such as Colace (over the counter) 100 mg twice a day.  Use MiraLax (over the counter) for constipation as needed.   Constipation Prevention   Complete by: As directed    Drink plenty of fluids.  Prune juice may be helpful.  You may use a stool softener, such as Colace (over the counter) 100 mg twice a day.  Use MiraLax (over the counter) for constipation as needed.   Diet - low sodium heart healthy   Complete by: As directed    Diet - low sodium heart healthy   Complete by: As  directed    Increase activity slowly as tolerated   Complete by: As directed    Weight bearing as tolerated with assist device (walker, cane, etc) as directed, use it as long as suggested by your surgeon or therapist, typically at least 4-6 weeks.   Increase activity slowly as tolerated   Complete by:  As directed    Weight bearing as tolerated with assist device (walker, cane, etc) as directed, use it as long as suggested by your surgeon or therapist, typically at least 4-6 weeks.   Post-operative opioid taper instructions:   Complete by: As directed    POST-OPERATIVE OPIOID TAPER INSTRUCTIONS: It is important to wean off of your opioid medication as soon as possible. If you do not need pain medication after your surgery it is ok to stop day one. Opioids include: Codeine, Hydrocodone(Norco, Vicodin), Oxycodone(Percocet, oxycontin) and hydromorphone amongst others.  Long term and even short term use of opiods can cause: Increased pain response Dependence Constipation Depression Respiratory depression And more.  Withdrawal symptoms can include Flu like symptoms Nausea, vomiting And more Techniques to manage these symptoms Hydrate well Eat regular healthy meals Stay active Use relaxation techniques(deep breathing, meditating, yoga) Do Not substitute Alcohol to help with tapering If you have been on opioids for less than two weeks and do not have pain than it is ok to stop all together.  Plan to wean off of opioids This plan should start within one week post op of your joint replacement. Maintain the same interval or time between taking each dose and first decrease the dose.  Cut the total daily intake of opioids by one tablet each day Next start to increase the time between doses. The last dose that should be eliminated is the evening dose.      Post-operative opioid taper instructions:   Complete by: As directed    POST-OPERATIVE OPIOID TAPER INSTRUCTIONS: It is important to wean off of your opioid medication as soon as possible. If you do not need pain medication after your surgery it is ok to stop day one. Opioids include: Codeine, Hydrocodone(Norco, Vicodin), Oxycodone(Percocet, oxycontin) and hydromorphone amongst others.  Long term and even short term use of opiods can  cause: Increased pain response Dependence Constipation Depression Respiratory depression And more.  Withdrawal symptoms can include Flu like symptoms Nausea, vomiting And more Techniques to manage these symptoms Hydrate well Eat regular healthy meals Stay active Use relaxation techniques(deep breathing, meditating, yoga) Do Not substitute Alcohol to help with tapering If you have been on opioids for less than two weeks and do not have pain than it is ok to stop all together.  Plan to wean off of opioids This plan should start within one week post op of your joint replacement. Maintain the same interval or time between taking each dose and first decrease the dose.  Cut the total daily intake of opioids by one tablet each day Next start to increase the time between doses. The last dose that should be eliminated is the evening dose.      TED hose   Complete by: As directed    Use stockings (TED hose) for 2 weeks on both leg(s).  You may remove them at night for sleeping.   TED hose   Complete by: As directed    Use stockings (TED hose) for 2 weeks on both leg(s).  You may remove them at night for sleeping.      Allergies as of 09/22/2021  Reactions   Codeine Hives   Percocet [oxycodone-acetaminophen]         Medication List     STOP taking these medications    ibuprofen 200 MG tablet Commonly known as: ADVIL       TAKE these medications    acetaminophen 500 MG tablet Commonly known as: TYLENOL Take 2 tablets (1,000 mg total) by mouth every 6 (six) hours.   ALPRAZolam 0.5 MG tablet Commonly known as: XANAX Take 0.5 mg by mouth 3 (three) times daily as needed for anxiety.   aspirin 81 MG chewable tablet Chew 1 tablet (81 mg total) by mouth 2 (two) times daily for 28 days.   CALCIUM 600+D3 PO Take 1 tablet by mouth in the morning.   cetirizine 10 MG tablet Commonly known as: ZYRTEC Take 10 mg by mouth in the morning.   docusate sodium 100 MG  capsule Commonly known as: COLACE Take 1 capsule (100 mg total) by mouth 2 (two) times daily.   escitalopram 10 MG tablet Commonly known as: LEXAPRO Take 10 mg by mouth every evening.   JUICE PLUS FIBRE PO Take 2 capsules by mouth in the morning. Vegetable Blend (1) + Fruit Blend (1)   meloxicam 15 MG tablet Commonly known as: MOBIC Take 1 tablet (15 mg total) by mouth daily.   methocarbamol 500 MG tablet Commonly known as: ROBAXIN Take 1 tablet (500 mg total) by mouth every 6 (six) hours as needed for muscle spasms.   metoprolol succinate 50 MG 24 hr tablet Commonly known as: TOPROL-XL Take 50 mg by mouth every evening. Take with or immediately following a meal.   ondansetron 4 MG tablet Commonly known as: ZOFRAN Take 1 tablet (4 mg total) by mouth every 6 (six) hours as needed for nausea.   polyethylene glycol 17 g packet Commonly known as: MIRALAX / GLYCOLAX Take 17 g by mouth daily as needed for mild constipation.   rosuvastatin 5 MG tablet Commonly known as: CRESTOR Take 5 mg by mouth every evening.   traMADol 50 MG tablet Commonly known as: ULTRAM Take 1-2 tablets (50-100 mg total) by mouth every 8 (eight) hours as needed for moderate pain or severe pain.   vitamin B-12 1000 MCG tablet Commonly known as: CYANOCOBALAMIN Take 1,000 mcg by mouth in the morning.   vitamin C 500 MG tablet Commonly known as: ASCORBIC ACID Take 500 mg by mouth daily as needed (immune health support).               Discharge Care Instructions  (From admission, onward)           Start     Ordered   09/21/21 0000  Change dressing       Comments: Maintain surgical dressing until follow up in the clinic. If the edges start to pull up, may reinforce with tape. If the dressing is no longer working, may remove and cover with gauze and tape, but must keep the area dry and clean.  Call with any questions or concerns.   09/21/21 1213   09/20/21 0000  Change dressing        Comments: Maintain surgical dressing until follow up in the clinic. If the edges start to pull up, may reinforce with tape. If the dressing is no longer working, may remove and cover with gauze and tape, but must keep the area dry and clean.  Call with any questions or concerns.   09/20/21 0900  Follow-up Information     Durene Romans, MD. Schedule an appointment as soon as possible for a visit in 2 week(s).   Specialty: Orthopedic Surgery Contact information: [address] [place] [address] [phone]                 Signed: Dennie Bible, PA-C Orthopedic Surgery 10/02/2021, 8:00 AM

## 2021-11-02 ENCOUNTER — Ambulatory Visit: Payer: Medicare HMO | Admitting: Neurology

## 2021-11-02 ENCOUNTER — Encounter: Payer: Self-pay | Admitting: Neurology

## 2021-11-02 VITALS — BP 128/75 | HR 68 | Ht 64.0 in | Wt 122.0 lb

## 2021-11-02 DIAGNOSIS — G3184 Mild cognitive impairment, so stated: Secondary | ICD-10-CM

## 2021-11-02 MED ORDER — DONEPEZIL HCL 5 MG PO TABS: 5.0000 mg | ORAL_TABLET | Freq: Every day | ORAL | 11 refills | Status: DC

## 2021-11-02 NOTE — Progress Notes (Signed)
[facility]  PATIENT: Kristen Smith DOB: [birth year]  REQUESTING CLINICIAN: Rhea Bleacher* HISTORY FROM: Patient and daughter  REASON FOR VISIT: Memory loss    HISTORICAL  CHIEF COMPLAINT:  Chief Complaint  Patient presents with   New Patient (Initial Visit)    Rm 12, with daughter,  C/o memory loss, short term memory has decline and mild tremors,     HISTORY OF PRESENT ILLNESS:  This is a 81 year old woman past medical history of hypertension, hyperlipidemia, anxiety depression and insomnia who is presenting with memory problem.  History mainly obtained from daughter.  Per daughter patient memory has been declining for the past year, she describes episode of forgetfulness and patient being very repetitive, stating the same information or asking the same questions.  She did have a left hip replacement 6 weeks ago and she cannot remember her hospitalization due to anesthesia and pain meds.  She report during the post operative period time she did have bilateral hand tremor that was still happening 2 to 3 weeks after the surgery but currently she is not experiencing them.  Also during that time she was having hallucinations and changes in her behavior and again could not remember her hospital stay.  Currently she is back to her normal self.  She is doing physical therapy at home.  Her gait is better and her hip pain is better controlled.  She lives with her husband, she cooks, cleans, she is handling the bills, she still drives and denies any recent accident and denies being lost in familiar places.  She does have a family history of Alzheimer's dementia in her mother.  She is worried that this is the beginning of Alzheimer disease   TBI:   No past history of TBI Stroke:   no past history of stroke Seizures:   no past history of seizures Sleep:   no history of sleep apnea.  Mood: Yes, anxiety and depression.   Functional status: independent in all ADLs and  IADLs Patient lives with husband  Cooking: Patient  Cleaning: Patient  Shopping: daughter due to previous left hip pain and covid pandemic  Bathing: Patient Toileting: Patient Driving: Patient  Bills: Patient   Ever left the stove on by accident?: Denies Forget how to use items around the house?: Denies Getting lost going to familiar places?: No  Forgetting loved ones names?: Denies Word finding difficulty? No  Sleep: Better    OTHER MEDICAL CONDITIONS: Anxiety, Depression, CAD, Hyperlipidemia, Hypertension   REVIEW OF SYSTEMS: Full 14 system review of systems performed and negative with exception of: As noted in the HPI   ALLERGIES: Allergies  Allergen Reactions   Codeine Hives   Percocet [Oxycodone-Acetaminophen]     HOME MEDICATIONS: Outpatient Medications Prior to Visit  Medication Sig Dispense Refill   acetaminophen (TYLENOL) 500 MG tablet Take 2 tablets (1,000 mg total) by mouth every 6 (six) hours. 30 tablet 0   ALPRAZolam (XANAX) 0.5 MG tablet Take 0.5 mg by mouth 3 (three) times daily as needed for anxiety.     Calcium Carb-Cholecalciferol (CALCIUM 600+D3 PO) Take 1 tablet by mouth in the morning.     cetirizine (ZYRTEC) 10 MG tablet Take 10 mg by mouth in the morning.     docusate sodium (COLACE) 100 MG capsule Take 1 capsule (100 mg total) by mouth 2 (two) times daily. 10 capsule 0   escitalopram (LEXAPRO) 10 MG tablet Take 10 mg by mouth every evening.  meloxicam (MOBIC) 15 MG tablet Take 1 tablet (15 mg total) by mouth daily. 30 tablet 0   methocarbamol (ROBAXIN) 500 MG tablet Take 1 tablet (500 mg total) by mouth every 6 (six) hours as needed for muscle spasms. 40 tablet 0   metoprolol succinate (TOPROL-XL) 50 MG 24 hr tablet Take 50 mg by mouth every evening. Take with or immediately following a meal.     Nutritional Supplements (JUICE PLUS FIBRE PO) Take 2 capsules by mouth in the morning. Vegetable Blend (1) + Fruit Blend (1)     ondansetron (ZOFRAN) 4  MG tablet Take 1 tablet (4 mg total) by mouth every 6 (six) hours as needed for nausea. 20 tablet 0   polyethylene glycol (MIRALAX / GLYCOLAX) 17 g packet Take 17 g by mouth daily as needed for mild constipation. 14 each 0   rosuvastatin (CRESTOR) 5 MG tablet Take 5 mg by mouth every evening.     traMADol (ULTRAM) 50 MG tablet Take 1-2 tablets (50-100 mg total) by mouth every 8 (eight) hours as needed for moderate pain or severe pain. 20 tablet 0   vitamin B-12 (CYANOCOBALAMIN) 1000 MCG tablet Take 1,000 mcg by mouth in the morning.     vitamin C (ASCORBIC ACID) 500 MG tablet Take 500 mg by mouth daily as needed (immune health support).     No facility-administered medications prior to visit.    PAST MEDICAL HISTORY: Past Medical History:  Diagnosis Date   Anxiety    Arthritis    Dysrhythmia    Hypertension    PONV (postoperative nausea and vomiting) 2003 ??   [facility]     PAST SURGICAL HISTORY: Past Surgical History:  Procedure Laterality Date   ABDOMINAL HYSTERECTOMY     CARDIAC CATHETERIZATION     when pt  had sxs ..irregular heart beat and chest pain .   PARTIAL MASTECTOMY WITH NEEDLE LOCALIZATION  04/29/2012   Procedure: PARTIAL MASTECTOMY WITH NEEDLE LOCALIZATION;  Surgeon: Maisie Fus A. Cornett, MD;  Location: [facility] OR;  Service: General;  Laterality: Left;  left breast partial mastectomy with needle localization   ROTATOR CUFF REPAIR     TONSILLECTOMY     as a child   TOTAL HIP ARTHROPLASTY Left 09/19/2021   Procedure: TOTAL HIP ARTHROPLASTY ANTERIOR APPROACH;  Surgeon: Durene Romans, MD;  Location: [facility];  Service: Orthopedics;  Laterality: Left;    FAMILY HISTORY: Family History  Problem Relation Age of Onset   Alzheimer's disease Mother     SOCIAL HISTORY: Social History   Socioeconomic History   Marital status: Married    Spouse name: Not on file   Number of children: Not on file   Years of education: Not on file   Highest education level: Not on file   Occupational History   Not on file  Tobacco Use   Smoking status: Never   Smokeless tobacco: Never  Vaping Use   Vaping Use: Never used  Substance and Sexual Activity   Alcohol use: No   Drug use: No   Sexual activity: Yes  Other Topics Concern   Not on file  Social History Narrative   Not on file   Social Determinants of Health   Financial Resource Strain: Not on file  Food Insecurity: Not on file  Transportation Needs: Not on file  Physical Activity: Not on file  Stress: Not on file  Social Connections: Not on file  Intimate Partner Violence: Not on file    PHYSICAL  EXAM  GENERAL EXAM/CONSTITUTIONAL: Vitals:  Vitals:   11/02/21 1407  BP: 128/75  Pulse: 68  Weight: 122 lb (55.3 kg)  Height: 5\' 4"  (1.626 m)   Body mass index is 20.94 kg/m. Wt Readings from Last 3 Encounters:  11/02/21 122 lb (55.3 kg)  09/19/21 123 lb (55.8 kg)  09/11/21 123 lb (55.8 kg)   Patient is in no distress; well developed, nourished and groomed; neck is supple  EYES: Pupils round and reactive to light, Visual fields full to confrontation, Extraocular movements intacts,   MUSCULOSKELETAL: Gait, strength, tone, movements noted in Neurologic exam below  NEUROLOGIC: MENTAL STATUS:      No data to display            11/02/2021    2:13 PM  Montreal Cognitive Assessment   Visuospatial/ Executive (0/5) 3  Naming (0/3) 3  Attention: Read list of digits (0/2) 2  Attention: Read list of letters (0/1) 1  Attention: Serial 7 subtraction starting at 100 (0/3) 1  Language: Repeat phrase (0/2) 1  Language : Fluency (0/1) 1  Abstraction (0/2) 1  Delayed Recall (0/5) 0  Orientation (0/6) 5  Total 18     CRANIAL NERVE:  2nd, 3rd, 4th, 6th - pupils equal and reactive to light, visual fields full to confrontation, extraocular muscles intact, no nystagmus 5th - facial sensation symmetric 7th - facial strength symmetric 8th - hearing intact 9th - palate elevates symmetrically,  uvula midline 11th - shoulder shrug symmetric 12th - tongue protrusion midline  MOTOR:  normal bulk and tone, full strength in the BUE, BLE  SENSORY:  normal and symmetric to light touch, vibration  COORDINATION:  finger-nose-finger, fine finger movements normal  REFLEXES:  deep tendon reflexes present and symmetric  GAIT/STATION:  Mild antalgic gait     DIAGNOSTIC DATA (LABS, IMAGING, TESTING) - I reviewed patient records, labs, notes, testing and imaging myself where available.  Lab Results  Component Value Date   WBC 14.1 (H) 09/21/2021   HGB 9.3 (L) 09/21/2021   HCT 28.0 (L) 09/21/2021   MCV 95.2 09/21/2021   PLT 240 09/21/2021      Component Value Date/Time   NA 135 09/20/2021 0340   K 3.6 09/20/2021 0340   CL 101 09/20/2021 0340   CO2 23 09/20/2021 0340   GLUCOSE 171 (H) 09/20/2021 0340   BUN 10 09/20/2021 0340   CREATININE 0.74 09/20/2021 0340   CALCIUM 8.6 (L) 09/20/2021 0340   PROT 6.9 04/21/2012 1345   ALBUMIN 3.9 04/21/2012 1345   AST 26 04/21/2012 1345   ALT 21 04/21/2012 1345   ALKPHOS 96 04/21/2012 1345   BILITOT 0.3 04/21/2012 1345   GFRNONAA >60 09/20/2021 0340   GFRAA >90 04/21/2012 1345   No results found for: "CHOL", "HDL", "LDLCALC", "LDLDIRECT", "TRIG", "CHOLHDL" No results found for: "HGBA1C" No results found for: "VITAMINB12" No results found for: "TSH"   MRI 01/18/2020 1. No acute intracranial abnormality. 2. Moderate chronic microvascular ischemic disease and mild diffuse cerebral volume loss without lobar predilection   ASSESSMENT AND PLAN  81 y.o. year old female with hypertension, hyperlipidemia, anxiety depression and insomnia who is presenting with memory problem.  Memory problem described as being forgetful and asking the same questions and also being repetitive.  Patient is still independent and lives with her husband and able to manage her own affairs.  She still drives, denies any recent accident or being lost in  familiar places.  On exam today, she scored  a 18 out of 30 on the [place] indicative for impairment.  Based on history and examination patient likely has mild cognitive impairment.  After further discussion, we agreed to start on Aricept 5 mg nightly.  Discussed the side effect of the medication including diarrhea, nightmares, and dizziness.  They understand to stop the medication and contact me if she experiences any side effects.  We also discussed ways to reduce the risk of Alzheimer disease including exercise, maintaining a good health, good sleep and good diet.  Continue to follow-up with your PCP, and return in 1 year for follow-up   1. Mild cognitive impairment     Patient Instructions  Start with Aricept 5 mg nightly, discussed side effect including nightmare, dizziness and diarrhea  Continue your other medications  Follow up in one year     There are well-accepted and sensible ways to reduce risk for Alzheimers disease and other degenerative brain disorders .  Exercise Daily Walk A daily 20 minute walk should be part of your routine. Disease related apathy can be a significant roadblock to exercise and the only way to overcome this is to make it a daily routine and perhaps have a reward at the end (something your loved one loves to eat or drink perhaps) or a personal trainer coming to the home can also be very useful. Most importantly, the patient is much more likely to exercise if the caregiver / spouse does it with him/her. In general a structured, repetitive schedule is best.  General Health: Any diseases which effect your body will effect your brain such as a pneumonia, urinary infection, blood clot, heart attack or stroke. Keep contact with your primary care doctor for regular follow ups.  Sleep. A good nights sleep is healthy for the brain. Seven hours is recommended. If you have insomnia or poor sleep habits we can give you some instructions. If you have sleep apnea wear your  mask.  Diet: Eating a heart healthy diet is also a good idea; fish and poultry instead of red meat, nuts (mostly non-peanuts), vegetables, fruits, olive oil or canola oil (instead of butter), minimal salt (use other spices to flavor foods), whole grain rice, bread, cereal and pasta and wine in moderation.Research is now showing that the MIND diet, which is a combination of The Mediterranean diet and the DASH diet, is beneficial for cognitive processing and longevity. Information about this diet can be found in The MIND Diet, a book by Alonna Minium, MS, RDN, and online at [employer].es  Finances, Power of [address] and Advance Directives: You should consider putting legal safeguards in place with regard to financial and medical decision making. While the spouse always has power of attorney for medical and financial issues in the absence of any form, you should consider what you want in case the spouse / caregiver is no longer around or capable of making decisions.      No orders of the defined types were placed in this encounter.   Meds ordered this encounter  Medications   donepezil (ARICEPT) 5 MG tablet    Sig: Take 1 tablet (5 mg total) by mouth at bedtime.    Dispense:  30 tablet    Refill:  11    Return in about 1 year (around 11/03/2022).  I have spent a total of 70 minutes dedicated to this patient today, preparing to see patient, performing a medically appropriate examination and evaluation, ordering tests and/or medications and procedures, and counseling and educating the  patient/family/caregiver; independently interpreting result and communicating results to the family/patient/caregiver; and documenting clinical information in the electronic medical record.   Windell NorfolkAmadou Belenda Alviar, MD 11/02/2021, 5:44 PM  [facility] [address], [address] [place], [place] [address] [phone]

## 2021-11-02 NOTE — Patient Instructions (Signed)
Start with Aricept 5 mg nightly, discussed side effect including nightmare, dizziness and diarrhea  Continue your other medications  Follow up in one year     There are well-accepted and sensible ways to reduce risk for Alzheimers disease and other degenerative brain disorders .  Exercise Daily Walk A daily 20 minute walk should be part of your routine. Disease related apathy can be a significant roadblock to exercise and the only way to overcome this is to make it a daily routine and perhaps have a reward at the end (something your loved one loves to eat or drink perhaps) or a personal trainer coming to the home can also be very useful. Most importantly, the patient is much more likely to exercise if the caregiver / spouse does it with him/her. In general a structured, repetitive schedule is best.  General Health: Any diseases which effect your body will effect your brain such as a pneumonia, urinary infection, blood clot, heart attack or stroke. Keep contact with your primary care doctor for regular follow ups.  Sleep. A good nights sleep is healthy for the brain. Seven hours is recommended. If you have insomnia or poor sleep habits we can give you some instructions. If you have sleep apnea wear your mask.  Diet: Eating a heart healthy diet is also a good idea; fish and poultry instead of red meat, nuts (mostly non-peanuts), vegetables, fruits, olive oil or canola oil (instead of butter), minimal salt (use other spices to flavor foods), whole grain rice, bread, cereal and pasta and wine in moderation.Research is now showing that the MIND diet, which is a combination of The Mediterranean diet and the DASH diet, is beneficial for cognitive processing and longevity. Information about this diet can be found in The MIND Diet, a book by Alonna Minium, MS, RDN, and online at [employer]  Finances, Power of [address] and Advance Directives: You should consider putting legal  safeguards in place with regard to financial and medical decision making. While the spouse always has power of attorney for medical and financial issues in the absence of any form, you should consider what you want in case the spouse / caregiver is no longer around or capable of making decisions.

## 2021-12-04 DIAGNOSIS — D225 Melanocytic nevi of trunk: Secondary | ICD-10-CM | POA: Insufficient documentation

## 2021-12-18 DIAGNOSIS — M545 Low back pain, unspecified: Secondary | ICD-10-CM | POA: Insufficient documentation

## 2022-03-23 DIAGNOSIS — U071 COVID-19: Secondary | ICD-10-CM

## 2022-03-23 HISTORY — DX: COVID-19: U07.1

## 2022-03-27 ENCOUNTER — Telehealth: Payer: Self-pay | Admitting: Cardiovascular Disease

## 2022-03-27 NOTE — Telephone Encounter (Signed)
States that fax was sent on 03/19/22 requesting pt's notes as well as medications. Pharmacy states that they did not receive signed medication list. Please advise

## 2022-03-27 NOTE — Telephone Encounter (Signed)
Called back to say the paperwork was signed correctly. Please advise

## 2022-03-27 NOTE — Telephone Encounter (Signed)
Returned call to pharmacy who states they were calling regarding prescription for "back support" advised them this was not ordered or managed by cardiology. They verbalized understanding.

## 2022-09-09 IMAGING — RF DG HIP (WITH OR WITHOUT PELVIS) 1V*L*
1 series · 3 of 3 positions shown · non-contrast
Comparison: 09/12/2020

CLINICAL DATA: Fluoroscopic assistance for left hip arthroplasty

EXAM:
DG HIP (WITH OR WITHOUT PELVIS) 1V*L*

[Series 1: unknown protocol · 0.20mm/px · 3 of 3 slices shown]
[im 1/3]
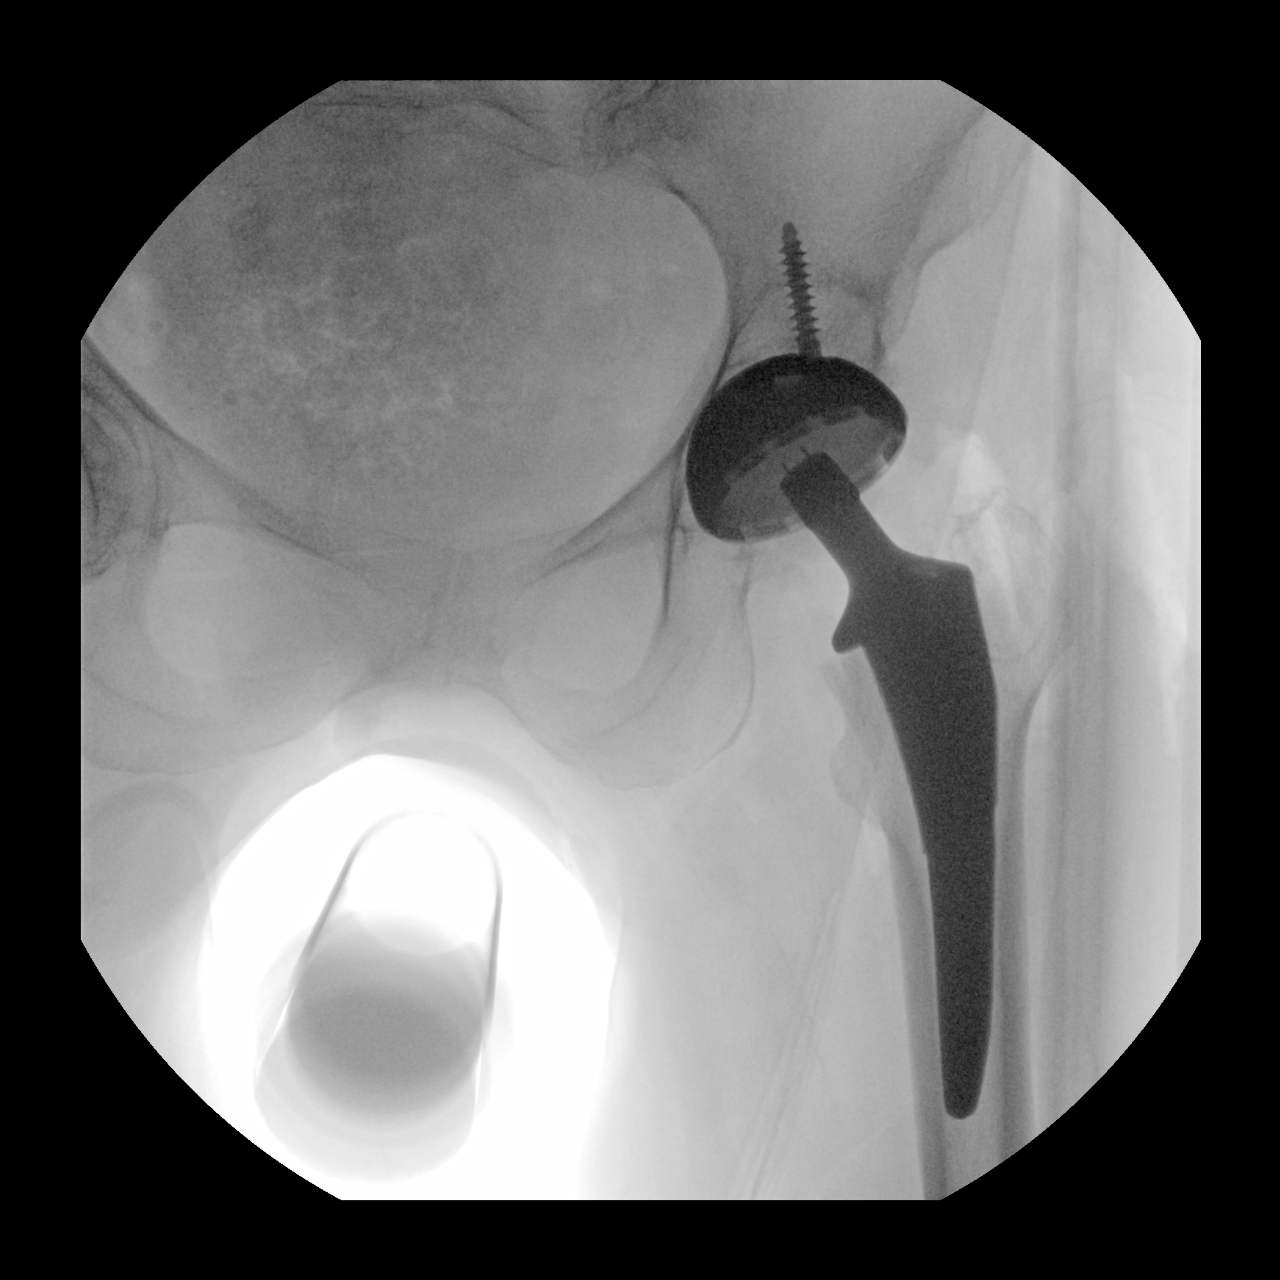
[im 2/3]
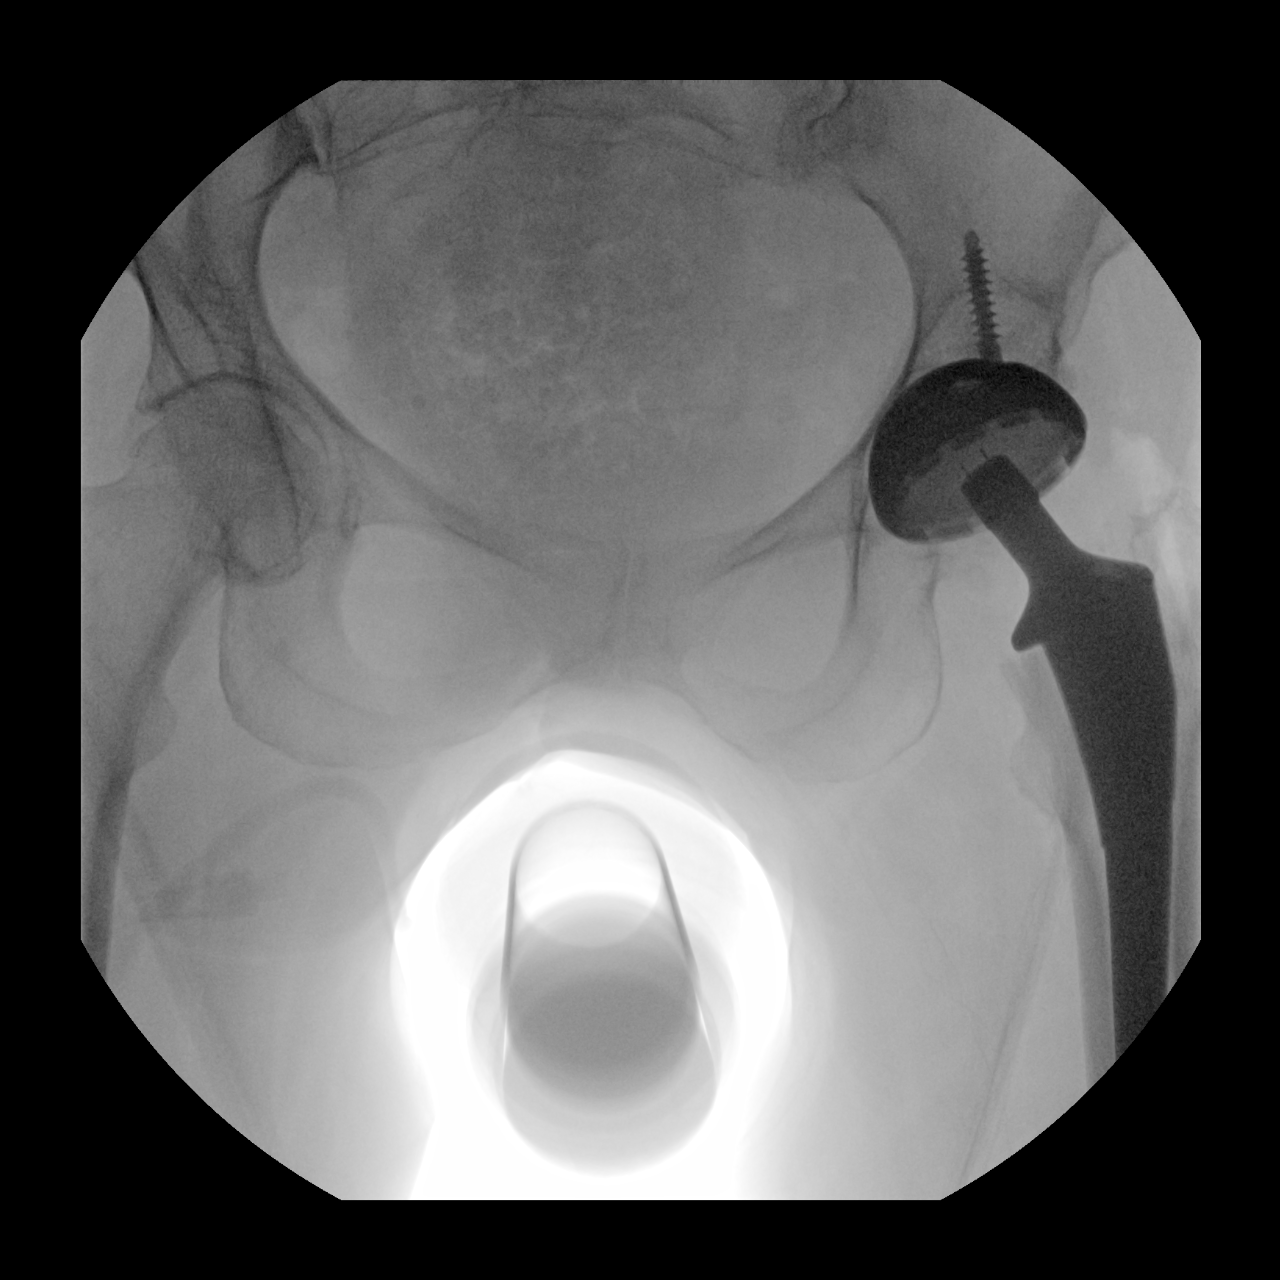
[im 3/3]
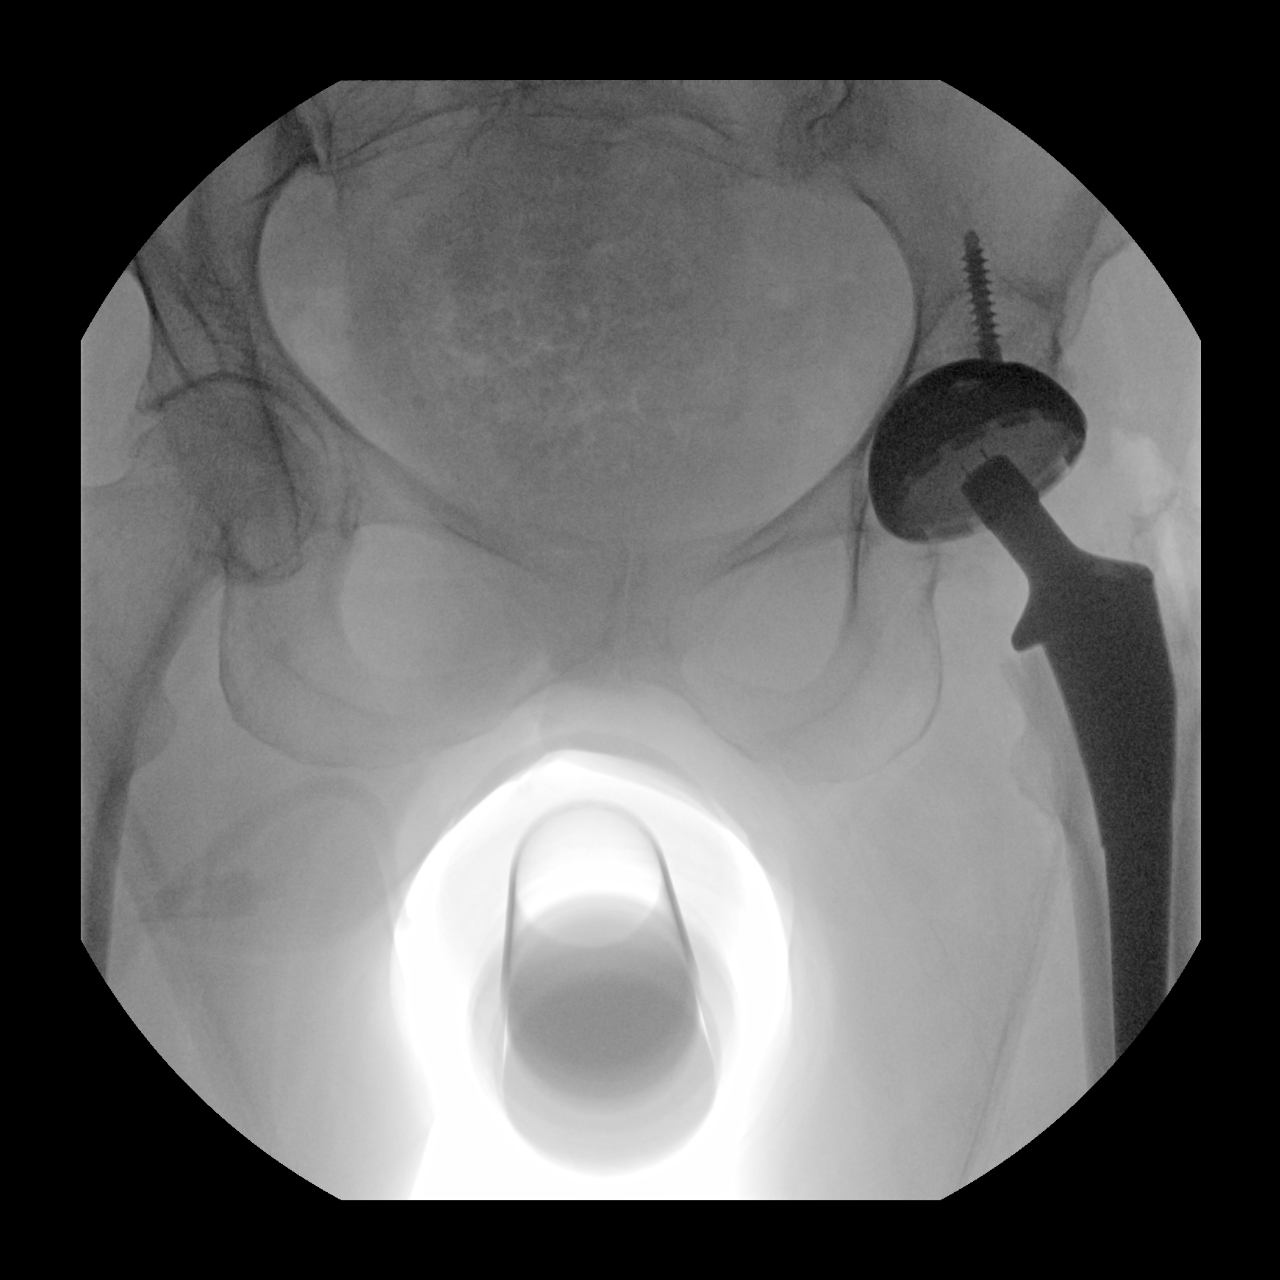

[3 of 3 positions shown; findings below may reference images not displayed]

FINDINGS: Fluoroscopic images show left hip arthroplasty. Fluoroscopic time
was 7 seconds. Radiation dose 1.01 mGy.
IMPRESSION: Fluoroscopic assistance was provided for left hip arthroplasty.

## 2022-10-02 DIAGNOSIS — S82143A Displaced bicondylar fracture of unspecified tibia, initial encounter for closed fracture: Secondary | ICD-10-CM | POA: Insufficient documentation

## 2022-10-08 ENCOUNTER — Ambulatory Visit: Payer: Self-pay | Admitting: Student

## 2022-10-09 ENCOUNTER — Encounter (HOSPITAL_COMMUNITY): Payer: Self-pay | Admitting: Student

## 2022-10-09 ENCOUNTER — Other Ambulatory Visit: Payer: Self-pay

## 2022-10-09 NOTE — Progress Notes (Signed)
Spoke with pt's daughter, Velora Mediate for pre-op call. DPR on file. She states pt had palpitations years ago but has not noticed any for several years. Pt is treated for HTN and is not diabetic.   Shower instructions given to [place] and she voiced understanding.   Chart sent to Antionette Poles PA, pt's daughter states that pt had hip replacement a  year at [facility] and was given general anesthesia. Pt had delirium after surgery and that delayed her discharge. Victorino Dike states Dr. Jena Gauss told her that he was planning to do a block and use Propofol day of surgery.

## 2022-10-09 NOTE — H&P (Signed)
Orthopaedic Trauma Service (OTS) Consult   Patient ID: Kristen Smith MRN: [id number] DOB/AGE: [birth year] 82 y.o.  Reason for surgery: Left tibial plateau fracture  HPI: Kristen Smith is an 82 y.o. female without significant past medical history presenting for surgery on left lower extremity.  Patient fell on 09/29/2022, landed on her left side.  Had immediate pain in the left knee was unable to weight-bear.  Was seen in [facility] emergency department and found to have a left tibial plateau fracture on x-ray and CT scan.  She was placed in a knee immobilizer instructed follow-up with orthopedics outpatient.  She was seen by Dr. Victorino Dike on 10/02/2022.  Due to complex nature of injury, Dr. Victorino Dike felt this would best be managed by an orthopedic traumatologist, and he therefore asked OTS to assume care of patient.  Patient seen in OTS clinic on 10/08/2022 accompanied by her 2 daughters.  Injury films were reviewed with the patient and her family and discussion was had regarding treatment options. Pain has been fairly well controlled. Denies any significant numbness or tingling through the left leg. Denies injury to other extremities. Patient presents now for surgical fixation of the left tibial plateau fracture. Per report by patient's daughter, patient had a hip replacement last year with Dr. Charlann Boxer. She had quite a bit of trouble afterwards with delirium in the postoperative period. By report she has gotten back to baseline. She is not a smoker. No history of diabetes. Patient ambulates at baseline with no assistive device.  Not currently on any anticoagulation.  Past Medical History:  Diagnosis Date   Anxiety    Arthritis    Dysrhythmia    Hypertension    PONV (postoperative nausea and vomiting) 2003 ??   [facility]     Past Surgical History:  Procedure Laterality Date   ABDOMINAL HYSTERECTOMY     CARDIAC CATHETERIZATION     when pt  had sxs ..irregular heart beat and chest pain .   PARTIAL  MASTECTOMY WITH NEEDLE LOCALIZATION  04/29/2012   Procedure: PARTIAL MASTECTOMY WITH NEEDLE LOCALIZATION;  Surgeon: Maisie Fus A. Cornett, MD;  Location: [facility] OR;  Service: General;  Laterality: Left;  left breast partial mastectomy with needle localization   ROTATOR CUFF REPAIR     TONSILLECTOMY     as a child   TOTAL HIP ARTHROPLASTY Left 09/19/2021   Procedure: TOTAL HIP ARTHROPLASTY ANTERIOR APPROACH;  Surgeon: Durene Romans, MD;  Location: [facility];  Service: Orthopedics;  Laterality: Left;    Family History  Problem Relation Age of Onset   Alzheimer's disease Mother     Social History:  reports that she has never smoked. She has never used smokeless tobacco. She reports that she does not drink alcohol and does not use drugs.  Allergies:  Allergies  Allergen Reactions   Codeine Hives   Percocet [Oxycodone-Acetaminophen] Other (See Comments)    Confusion    Medications: I have reviewed the patient's current medications. Prior to Admission:  No medications prior to admission.    ROS: Constitutional: No fever or chills Vision: No changes in vision ENT: No difficulty swallowing CV: No chest pain Pulm: No SOB or wheezing GI: No nausea or vomiting GU: No urgency or inability to hold urine Skin: No poor wound healing Neurologic: No numbness or tingling Psychiatric: No depression or anxiety Heme: No bruising Allergic: No reaction to medications or food   Exam: There were no vitals taken for this visit. General: No acute distress  Orientation:A&O x4 Mood and Affect: Mood and affect appropriate. Pleasant and cooperative Gait: Nonweightbearing left lower extremity Coordination and balance: WNL  LLE: Minimal to mild swelling through the knee. Knee immobilizer removed for exam. Tolerates gentle knee ROM without significant increase in pain. Motor and sensory function intact throughout lower extremity. Neurovascularly intact  RLE: Skin without lesions. No tenderness to palpation. Full  painless ROM, full strength in each muscle group without evidence of instability.  Motor and sensory function at baseline.  Neurovascularly intact   Medical Decision Making: Data: Imaging: AP and lateral views left knee shows lateral tibial plateau fracture  Labs: No results found for this or any previous visit (from the past 168 hour(s)).   Assessment/Plan: 82 year old female s/p fall 09/29/22 resulting in left lateral tibial plateau fracture  After thorough discussion regarding operative versus nonoperative management with the patient and her family, including risks and benefits of each the patient would like to proceed with open reduction internal fixation of the left tibial plateau. My concern with non-operative management would be that patient would go on to significant post-traumatic arthritis of the knee joint. Risks of operative management were discussed included bleeding, infection, malunion, nonunion, damage to surrounding nerves and blood vessels, pain, hardware prominence or irritation, hardware failure, stiffness, post-traumatic arthritis, DVT/PE, compartment syndrome, and even anesthesia complications.  Patient states understanding of these risks and agrees to proceed with surgery.  Consent will be obtained.  Would like to attempt surgery under regional and MAC anesthesia if possible.  We will plan to admit the patient postoperatively for pain control and therapies.  Pending therapy evaluation, we will determine appropriate disposition at discharge.   Thompson Caul PA-C [facility] [phone] (office) [email or web address]

## 2022-10-09 NOTE — Anesthesia Preprocedure Evaluation (Addendum)
Anesthesia Evaluation    Reviewed: Allergy & Precautions, Patient's Chart, lab work & pertinent test results  History of Anesthesia Complications (+) PONV and history of anesthetic complications  Airway Mallampati: II  TM Distance: >3 FB Neck ROM: Full    Dental no notable dental hx. (+) Dental Advisory Given, Teeth Intact   Pulmonary neg pulmonary ROS   Pulmonary exam normal breath sounds clear to auscultation       Cardiovascular hypertension, Pt. on home beta blockers and Pt. on medications Normal cardiovascular exam+ dysrhythmias  Rhythm:Regular Rate:Normal  Echo 08/2021 1. Left ventricular ejection fraction, by estimation, is 60 to 65%. The left ventricle has normal function. The left ventricle has no regional wall motion abnormalities. Left ventricular diastolic parameters are consistent with Grade I diastolic dysfunction (impaired relaxation). Elevated left atrial pressure. The average left ventricular global longitudinal strain is -17.4 %. The global longitudinal strain is normal.  2. Right ventricular systolic function is normal. The right ventricular size is normal.  3. Left atrial size was mildly dilated.  4. The mitral valve is normal in structure. Trivial mitral valve regurgitation. No evidence of mitral stenosis.  5. The aortic valve is tricuspid. Aortic valve regurgitation is not visualized. Aortic valve sclerosis is present, with no evidence of aortic valve stenosis.  6. The inferior vena cava is normal in size with greater than 50% respiratory variability, suggesting right atrial pressure of 3 mmHg.    Neuro/Psych  PSYCHIATRIC DISORDERS Anxiety     negative neurological ROS     GI/Hepatic negative GI ROS, Neg liver ROS,,,  Endo/Other  negative endocrine ROS    Renal/GU negative Renal ROS     Musculoskeletal  (+) Arthritis ,    Abdominal   Peds  Hematology negative hematology ROS (+)   Anesthesia  Other Findings   Reproductive/Obstetrics                             Anesthesia Physical Anesthesia Plan  ASA: 3  Anesthesia Plan: Spinal and MAC   Post-op Pain Management: Toradol IV (intra-op)* and Ofirmev IV (intra-op)*   Induction: Intravenous  PONV Risk Score and Plan: 4 or greater and Ondansetron, Dexamethasone, Treatment may vary due to age or medical condition, Propofol infusion and TIVA  Airway Management Planned: Natural Airway, Simple Face Mask and Mask  Additional Equipment: None  Intra-op Plan:   Post-operative Plan: Extubation in OR  Informed Consent: I have reviewed the patients History and Physical, chart, labs and discussed the procedure including the risks, benefits and alternatives for the proposed anesthesia with the patient or authorized representative who has indicated his/her understanding and acceptance.     Dental advisory given  Plan Discussed with: CRNA and Anesthesiologist  Anesthesia Plan Comments: (See PAT note 09/11/2021)        Anesthesia Quick Evaluation

## 2022-10-10 ENCOUNTER — Encounter (HOSPITAL_COMMUNITY): Payer: Self-pay | Admitting: Student

## 2022-10-10 ENCOUNTER — Inpatient Hospital Stay (HOSPITAL_COMMUNITY): Payer: Medicare HMO

## 2022-10-10 ENCOUNTER — Inpatient Hospital Stay (HOSPITAL_COMMUNITY): Payer: Medicare HMO | Admitting: Physician Assistant

## 2022-10-10 ENCOUNTER — Encounter (HOSPITAL_COMMUNITY)
Admission: RE | Disposition: A | Discharge status: Home Health | Payer: Self-pay | Source: Home / Self Care | Attending: Student

## 2022-10-10 ENCOUNTER — Other Ambulatory Visit: Payer: Self-pay

## 2022-10-10 ENCOUNTER — Inpatient Hospital Stay (HOSPITAL_COMMUNITY)
Admission: RE | Admit: 2022-10-10 | Discharge: 2022-10-11 | DRG: 493 | Disposition: A | Discharge status: Home Health | Payer: Medicare HMO | Attending: Student | Admitting: Student

## 2022-10-10 DIAGNOSIS — S82122A Displaced fracture of lateral condyle of left tibia, initial encounter for closed fracture: Secondary | ICD-10-CM | POA: Diagnosis not present

## 2022-10-10 DIAGNOSIS — W1830XA Fall on same level, unspecified, initial encounter: Secondary | ICD-10-CM | POA: Diagnosis present

## 2022-10-10 DIAGNOSIS — F419 Anxiety disorder, unspecified: Secondary | ICD-10-CM | POA: Diagnosis present

## 2022-10-10 DIAGNOSIS — Y929 Unspecified place or not applicable: Secondary | ICD-10-CM | POA: Diagnosis not present

## 2022-10-10 DIAGNOSIS — S82142A Displaced bicondylar fracture of left tibia, initial encounter for closed fracture: Principal | ICD-10-CM | POA: Diagnosis present

## 2022-10-10 DIAGNOSIS — Z96642 Presence of left artificial hip joint: Secondary | ICD-10-CM | POA: Diagnosis present

## 2022-10-10 DIAGNOSIS — Z820 Family history of epilepsy and other diseases of the nervous system: Secondary | ICD-10-CM | POA: Diagnosis not present

## 2022-10-10 DIAGNOSIS — Z8616 Personal history of COVID-19: Secondary | ICD-10-CM

## 2022-10-10 DIAGNOSIS — Z885 Allergy status to narcotic agent status: Secondary | ICD-10-CM | POA: Diagnosis not present

## 2022-10-10 DIAGNOSIS — I10 Essential (primary) hypertension: Secondary | ICD-10-CM | POA: Diagnosis present

## 2022-10-10 DIAGNOSIS — E871 Hypo-osmolality and hyponatremia: Secondary | ICD-10-CM | POA: Diagnosis not present

## 2022-10-10 DIAGNOSIS — D62 Acute posthemorrhagic anemia: Secondary | ICD-10-CM | POA: Diagnosis not present

## 2022-10-10 HISTORY — PX: ORIF TIBIA PLATEAU: SHX2132

## 2022-10-10 LAB — CBC
HCT: 42.2 % (ref 36.0–46.0)
Hemoglobin: 14.2 g/dL (ref 12.0–15.0)
MCH: 31.2 pg (ref 26.0–34.0)
MCHC: 33.6 g/dL (ref 30.0–36.0)
MCV: 92.7 fL (ref 80.0–100.0)
Platelets: 304 10*3/uL (ref 150–400)
RBC: 4.55 MIL/uL (ref 3.87–5.11)
RDW: 13.2 % (ref 11.5–15.5)
WBC: 6.9 10*3/uL (ref 4.0–10.5)
nRBC: 0 % (ref 0.0–0.2)

## 2022-10-10 LAB — BASIC METABOLIC PANEL
Anion gap: 11 (ref 5–15)
BUN: 12 mg/dL (ref 8–23)
CO2: 24 mmol/L (ref 22–32)
Calcium: 9.2 mg/dL (ref 8.9–10.3)
Chloride: 96 mmol/L — ABNORMAL LOW (ref 98–111)
Creatinine, Ser: 0.69 mg/dL (ref 0.44–1.00)
GFR, Estimated: >60 mL/min (ref >60)
Glucose, Bld: 90 mg/dL (ref 70–99)
Potassium: 4.2 mmol/L (ref 3.5–5.1)
Sodium: 131 mmol/L — ABNORMAL LOW (ref 135–145)

## 2022-10-10 LAB — VITAMIN D 25 HYDROXY (VIT D DEFICIENCY, FRACTURES): Vit D, 25-Hydroxy: 23.5 ng/mL — ABNORMAL LOW (ref 30–100)

## 2022-10-10 SURGERY — OPEN REDUCTION INTERNAL FIXATION (ORIF) TIBIAL PLATEAU
Anesthesia: Monitor Anesthesia Care | Laterality: Left

## 2022-10-10 MED ORDER — OXYCODONE HCL 5 MG PO TABS
ORAL_TABLET | ORAL | Status: AC
  Filled 2022-10-10: qty 1

## 2022-10-10 MED ORDER — MEPERIDINE HCL 25 MG/ML IJ SOLN: 6.2500 mg | INTRAMUSCULAR | Status: DC | PRN

## 2022-10-10 MED ORDER — DONEPEZIL HCL 10 MG PO TABS
5.0000 mg | ORAL_TABLET | Freq: Every day | ORAL | Status: DC
  Administered 2022-10-10: 5 mg via ORAL
  Filled 2022-10-10: qty 1

## 2022-10-10 MED ORDER — METHOCARBAMOL 500 MG PO TABS
ORAL_TABLET | ORAL | Status: AC
  Administered 2022-10-11: 500 mg via ORAL
  Filled 2022-10-10: qty 1

## 2022-10-10 MED ORDER — OXYCODONE HCL 5 MG PO TABS
5.0000 mg | ORAL_TABLET | Freq: Once | ORAL | Status: AC | PRN
  Administered 2022-10-10: 5 mg via ORAL

## 2022-10-10 MED ORDER — ONDANSETRON HCL 4 MG PO TABS: 4.0000 mg | ORAL_TABLET | Freq: Four times a day (QID) | ORAL | Status: DC | PRN

## 2022-10-10 MED ORDER — FENTANYL CITRATE (PF) 250 MCG/5ML IJ SOLN
INTRAMUSCULAR | Status: AC
  Filled 2022-10-10: qty 5

## 2022-10-10 MED ORDER — ONDANSETRON HCL 4 MG/2ML IJ SOLN
INTRAMUSCULAR | Status: AC
  Filled 2022-10-10: qty 2

## 2022-10-10 MED ORDER — METOCLOPRAMIDE HCL 5 MG PO TABS: 5.0000 mg | ORAL_TABLET | Freq: Three times a day (TID) | ORAL | Status: DC | PRN

## 2022-10-10 MED ORDER — CEFAZOLIN SODIUM-DEXTROSE 2-4 GM/100ML-% IV SOLN
2.0000 g | INTRAVENOUS | Status: AC
  Administered 2022-10-10: 2 g via INTRAVENOUS
  Filled 2022-10-10: qty 100

## 2022-10-10 MED ORDER — PROPOFOL 500 MG/50ML IV EMUL
INTRAVENOUS | Status: DC | PRN
  Administered 2022-10-10: 100 ug/kg/min via INTRAVENOUS

## 2022-10-10 MED ORDER — METHOCARBAMOL 1000 MG/10ML IJ SOLN: 500.0000 mg | Freq: Four times a day (QID) | INTRAVENOUS | Status: DC | PRN

## 2022-10-10 MED ORDER — CHLORHEXIDINE GLUCONATE 0.12 % MT SOLN
15.0000 mL | Freq: Once | OROMUCOSAL | Status: AC
  Administered 2022-10-10: 15 mL via OROMUCOSAL
  Filled 2022-10-10: qty 15

## 2022-10-10 MED ORDER — BUPIVACAINE HCL (PF) 0.25 % IJ SOLN
INTRAMUSCULAR | Status: AC
  Filled 2022-10-10: qty 30

## 2022-10-10 MED ORDER — 0.9 % SODIUM CHLORIDE (POUR BTL) OPTIME
TOPICAL | Status: DC | PRN
  Administered 2022-10-10: 1000 mL

## 2022-10-10 MED ORDER — OXYCODONE HCL 5 MG/5ML PO SOLN: 5.0000 mg | Freq: Once | ORAL | Status: AC | PRN

## 2022-10-10 MED ORDER — ACETAMINOPHEN 325 MG PO TABS: 325.0000 mg | ORAL_TABLET | ORAL | Status: DC | PRN

## 2022-10-10 MED ORDER — LORATADINE 10 MG PO TABS
10.0000 mg | ORAL_TABLET | Freq: Every day | ORAL | Status: DC
  Administered 2022-10-11: 10 mg via ORAL
  Filled 2022-10-10: qty 1

## 2022-10-10 MED ORDER — FENTANYL CITRATE (PF) 250 MCG/5ML IJ SOLN
INTRAMUSCULAR | Status: DC | PRN
  Administered 2022-10-10: 25 ug via INTRAVENOUS

## 2022-10-10 MED ORDER — PROPOFOL 10 MG/ML IV BOLUS
INTRAVENOUS | Status: AC
  Filled 2022-10-10: qty 20

## 2022-10-10 MED ORDER — ONDANSETRON HCL 4 MG/2ML IJ SOLN
INTRAMUSCULAR | Status: DC | PRN
  Administered 2022-10-10: 4 mg via INTRAVENOUS

## 2022-10-10 MED ORDER — SODIUM CHLORIDE 0.9 % IV SOLN: INTRAVENOUS | Status: DC

## 2022-10-10 MED ORDER — MAGNESIUM GLUCONATE 500 MG PO TABS
500.0000 mg | ORAL_TABLET | Freq: Every day | ORAL | Status: DC
  Administered 2022-10-10: 500 mg via ORAL
  Filled 2022-10-10 (×2): qty 1

## 2022-10-10 MED ORDER — EPHEDRINE 5 MG/ML INJ
INTRAVENOUS | Status: AC
  Filled 2022-10-10: qty 5

## 2022-10-10 MED ORDER — METOPROLOL SUCCINATE ER 50 MG PO TB24
50.0000 mg | ORAL_TABLET | Freq: Every day | ORAL | Status: DC
  Administered 2022-10-10: 50 mg via ORAL
  Filled 2022-10-10: qty 1

## 2022-10-10 MED ORDER — BUPIVACAINE HCL (PF) 0.25 % IJ SOLN
INTRAMUSCULAR | Status: DC | PRN
  Administered 2022-10-10: 19 mL

## 2022-10-10 MED ORDER — KETOROLAC TROMETHAMINE 15 MG/ML IJ SOLN
15.0000 mg | Freq: Four times a day (QID) | INTRAMUSCULAR | Status: DC
  Administered 2022-10-10 – 2022-10-11 (×3): 15 mg via INTRAVENOUS
  Filled 2022-10-10 (×4): qty 1

## 2022-10-10 MED ORDER — ALPRAZOLAM 0.5 MG PO TABS
0.5000 mg | ORAL_TABLET | Freq: Three times a day (TID) | ORAL | Status: DC | PRN
  Administered 2022-10-10 – 2022-10-11 (×2): 0.5 mg via ORAL
  Filled 2022-10-10 (×2): qty 1

## 2022-10-10 MED ORDER — ENOXAPARIN SODIUM 40 MG/0.4ML IJ SOSY
40.0000 mg | PREFILLED_SYRINGE | INTRAMUSCULAR | Status: DC
  Administered 2022-10-11: 40 mg via SUBCUTANEOUS
  Filled 2022-10-10: qty 0.4

## 2022-10-10 MED ORDER — VITAMIN B-12 1000 MCG PO TABS
1000.0000 ug | ORAL_TABLET | Freq: Every morning | ORAL | Status: DC
  Administered 2022-10-11: 1000 ug via ORAL
  Filled 2022-10-10: qty 1

## 2022-10-10 MED ORDER — POLYETHYLENE GLYCOL 3350 17 G PO PACK: 17.0000 g | PACK | Freq: Every day | ORAL | Status: DC | PRN

## 2022-10-10 MED ORDER — CEFAZOLIN SODIUM-DEXTROSE 2-4 GM/100ML-% IV SOLN
2.0000 g | Freq: Three times a day (TID) | INTRAVENOUS | Status: AC
  Administered 2022-10-10 – 2022-10-11 (×3): 2 g via INTRAVENOUS
  Filled 2022-10-10 (×3): qty 100

## 2022-10-10 MED ORDER — LACTATED RINGERS IV SOLN: INTRAVENOUS | Status: DC

## 2022-10-10 MED ORDER — PROPOFOL 10 MG/ML IV BOLUS
INTRAVENOUS | Status: DC | PRN
  Administered 2022-10-10: 20 mg via INTRAVENOUS
  Administered 2022-10-10: 10 mg via INTRAVENOUS

## 2022-10-10 MED ORDER — FENTANYL CITRATE (PF) 100 MCG/2ML IJ SOLN
INTRAMUSCULAR | Status: AC
  Administered 2022-10-10: 25 ug via INTRAVENOUS
  Filled 2022-10-10: qty 2

## 2022-10-10 MED ORDER — VANCOMYCIN HCL 1000 MG IV SOLR
INTRAVENOUS | Status: DC | PRN
  Administered 2022-10-10: 1000 mg

## 2022-10-10 MED ORDER — VANCOMYCIN HCL 1000 MG IV SOLR
INTRAVENOUS | Status: AC
  Filled 2022-10-10: qty 20

## 2022-10-10 MED ORDER — EPHEDRINE SULFATE-NACL 50-0.9 MG/10ML-% IV SOSY
PREFILLED_SYRINGE | INTRAVENOUS | Status: DC | PRN
  Administered 2022-10-10: 10 mg via INTRAVENOUS
  Administered 2022-10-10: 5 mg via INTRAVENOUS

## 2022-10-10 MED ORDER — ACETAMINOPHEN 10 MG/ML IV SOLN
INTRAVENOUS | Status: DC | PRN
  Administered 2022-10-10: 1000 mg via INTRAVENOUS

## 2022-10-10 MED ORDER — METOCLOPRAMIDE HCL 5 MG/ML IJ SOLN: 5.0000 mg | Freq: Three times a day (TID) | INTRAMUSCULAR | Status: DC | PRN

## 2022-10-10 MED ORDER — ADULT MULTIVITAMIN W/MINERALS CH
1.0000 | ORAL_TABLET | Freq: Every day | ORAL | Status: DC
  Administered 2022-10-11: 1 via ORAL
  Filled 2022-10-10: qty 1

## 2022-10-10 MED ORDER — ONDANSETRON HCL 4 MG/2ML IJ SOLN
4.0000 mg | Freq: Four times a day (QID) | INTRAMUSCULAR | Status: DC | PRN
  Administered 2022-10-11: 4 mg via INTRAVENOUS
  Filled 2022-10-10: qty 2

## 2022-10-10 MED ORDER — ACETAMINOPHEN 160 MG/5ML PO SOLN: 325.0000 mg | ORAL | Status: DC | PRN

## 2022-10-10 MED ORDER — ROSUVASTATIN CALCIUM 5 MG PO TABS
5.0000 mg | ORAL_TABLET | Freq: Every day | ORAL | Status: DC
  Administered 2022-10-10: 5 mg via ORAL
  Filled 2022-10-10: qty 1

## 2022-10-10 MED ORDER — ESCITALOPRAM OXALATE 10 MG PO TABS
10.0000 mg | ORAL_TABLET | Freq: Every day | ORAL | Status: DC
  Administered 2022-10-10: 10 mg via ORAL
  Filled 2022-10-10: qty 1

## 2022-10-10 MED ORDER — KETOROLAC TROMETHAMINE 30 MG/ML IJ SOLN
INTRAMUSCULAR | Status: DC | PRN
  Administered 2022-10-10: 30 mg via INTRAVENOUS

## 2022-10-10 MED ORDER — FENTANYL CITRATE (PF) 100 MCG/2ML IJ SOLN
25.0000 ug | INTRAMUSCULAR | Status: DC | PRN
  Administered 2022-10-10: 12.5 ug via INTRAVENOUS

## 2022-10-10 MED ORDER — DOCUSATE SODIUM 100 MG PO CAPS
100.0000 mg | ORAL_CAPSULE | Freq: Two times a day (BID) | ORAL | Status: DC
  Administered 2022-10-10 – 2022-10-11 (×2): 100 mg via ORAL
  Filled 2022-10-10 (×2): qty 1

## 2022-10-10 MED ORDER — HYDRALAZINE HCL 10 MG PO TABS: 10.0000 mg | ORAL_TABLET | Freq: Four times a day (QID) | ORAL | Status: DC | PRN

## 2022-10-10 MED ORDER — DIPHENHYDRAMINE HCL 12.5 MG/5ML PO ELIX: 12.5000 mg | ORAL_SOLUTION | ORAL | Status: DC | PRN

## 2022-10-10 MED ORDER — ACETAMINOPHEN 500 MG PO TABS: 1000.0000 mg | ORAL_TABLET | Freq: Four times a day (QID) | ORAL | Status: DC

## 2022-10-10 MED ORDER — PHENYLEPHRINE HCL-NACL 20-0.9 MG/250ML-% IV SOLN
INTRAVENOUS | Status: DC | PRN
  Administered 2022-10-10: 25 ug/min via INTRAVENOUS

## 2022-10-10 MED ORDER — ORAL CARE MOUTH RINSE: 15.0000 mL | Freq: Once | OROMUCOSAL | Status: AC

## 2022-10-10 MED ORDER — LIDOCAINE 2% (20 MG/ML) 5 ML SYRINGE
INTRAMUSCULAR | Status: AC
  Filled 2022-10-10: qty 5

## 2022-10-10 MED ORDER — ACETAMINOPHEN 10 MG/ML IV SOLN
INTRAVENOUS | Status: AC
  Filled 2022-10-10: qty 100

## 2022-10-10 MED ORDER — ONDANSETRON HCL 4 MG/2ML IJ SOLN: 4.0000 mg | Freq: Once | INTRAMUSCULAR | Status: DC | PRN

## 2022-10-10 MED ORDER — METHOCARBAMOL 500 MG PO TABS
500.0000 mg | ORAL_TABLET | Freq: Four times a day (QID) | ORAL | Status: DC | PRN
  Administered 2022-10-10: 500 mg via ORAL
  Filled 2022-10-10: qty 1

## 2022-10-10 SURGICAL SUPPLY — 84 items
APL PRP STRL LF DISP 70% ISPRP (MISCELLANEOUS) ×2
APL SKNCLS STERI-STRIP NONHPOA (GAUZE/BANDAGES/DRESSINGS) ×1
BAG COUNTER SPONGE SURGICOUNT (BAG) ×2 IMPLANT
BAG SPNG CNTER NS LX DISP (BAG) ×1
BANDAGE ESMARK 6X9 LF (GAUZE/BANDAGES/DRESSINGS) ×2 IMPLANT
BENZOIN TINCTURE PRP APPL 2/3 (GAUZE/BANDAGES/DRESSINGS) IMPLANT
BIT DRILL CALIBR QC 2.8X250 (BIT) IMPLANT
BIT DRILL QC SFS 2.5X170 (BIT) IMPLANT
BLADE CLIPPER SURG (BLADE) IMPLANT
BLADE SURG 15 STRL LF DISP TIS (BLADE) ×2 IMPLANT
BLADE SURG 15 STRL SS (BLADE) ×1
BNDG CMPR 5X4 KNIT ELC UNQ LF (GAUZE/BANDAGES/DRESSINGS) ×1
BNDG CMPR 5X62 HK CLSR LF (GAUZE/BANDAGES/DRESSINGS) ×1
BNDG CMPR 6 X 5 YARDS HK CLSR (GAUZE/BANDAGES/DRESSINGS) ×1
BNDG CMPR 9X6 STRL LF SNTH (GAUZE/BANDAGES/DRESSINGS) ×1
BNDG ELASTIC 4INX 5YD STR LF (GAUZE/BANDAGES/DRESSINGS) IMPLANT
BNDG ELASTIC 4X5.8 VLCR STR LF (GAUZE/BANDAGES/DRESSINGS) ×2 IMPLANT
BNDG ELASTIC 6INX 5YD STR LF (GAUZE/BANDAGES/DRESSINGS) IMPLANT
BNDG ELASTIC 6X5.8 VLCR STR LF (GAUZE/BANDAGES/DRESSINGS) ×2 IMPLANT
BNDG ESMARK 6X9 LF (GAUZE/BANDAGES/DRESSINGS) ×1
BNDG GAUZE DERMACEA FLUFF 4 (GAUZE/BANDAGES/DRESSINGS) ×2 IMPLANT
BNDG GZE DERMACEA 4 6PLY (GAUZE/BANDAGES/DRESSINGS)
BONE CANC CHIPS 20CC PCAN1/4 (Bone Implant) ×1 IMPLANT
BRUSH SCRUB EZ PLAIN DRY (MISCELLANEOUS) ×4 IMPLANT
CANISTER SUCT 3000ML PPV (MISCELLANEOUS) ×2 IMPLANT
CHIPS CANC BONE 20CC PCAN1/4 (Bone Implant) ×1 IMPLANT
CHLORAPREP W/TINT 26 (MISCELLANEOUS) ×4 IMPLANT
COVER SURGICAL LIGHT HANDLE (MISCELLANEOUS) ×2 IMPLANT
CUFF TOURN SGL QUICK 34 (TOURNIQUET CUFF) ×1
CUFF TRNQT CYL 34X4.125X (TOURNIQUET CUFF) ×2 IMPLANT
DRAPE C-ARM 42X72 X-RAY (DRAPES) ×2 IMPLANT
DRAPE C-ARMOR (DRAPES) ×2 IMPLANT
DRAPE ORTHO SPLIT 77X108 STRL (DRAPES) ×2
DRAPE SURG ORHT 6 SPLT 77X108 (DRAPES) ×4 IMPLANT
DRAPE U-SHAPE 47X51 STRL (DRAPES) ×2 IMPLANT
ELECT REM PT RETURN 9FT ADLT (ELECTROSURGICAL) ×1
ELECTRODE REM PT RTRN 9FT ADLT (ELECTROSURGICAL) ×2 IMPLANT
GAUZE PAD ABD 8X10 STRL (GAUZE/BANDAGES/DRESSINGS) ×4 IMPLANT
GAUZE SPONGE 4X4 12PLY STRL (GAUZE/BANDAGES/DRESSINGS) ×2 IMPLANT
GLOVE BIO SURGEON STRL SZ 6.5 (GLOVE) ×6 IMPLANT
GLOVE BIO SURGEON STRL SZ7.5 (GLOVE) ×8 IMPLANT
GLOVE BIOGEL PI IND STRL 6.5 (GLOVE) ×2 IMPLANT
GLOVE BIOGEL PI IND STRL 7.5 (GLOVE) ×2 IMPLANT
GOWN STRL REUS W/ TWL LRG LVL3 (GOWN DISPOSABLE) ×4 IMPLANT
GOWN STRL REUS W/TWL LRG LVL3 (GOWN DISPOSABLE) ×2
GRAFT BNE CANC CHIPS 1-8 20CC (Bone Implant) IMPLANT
IMMOBILIZER KNEE 22 UNIV (SOFTGOODS) ×2 IMPLANT
K-WIRE 1.6X150 (WIRE) ×1
KIT BASIN OR (CUSTOM PROCEDURE TRAY) ×2 IMPLANT
KIT TURNOVER KIT B (KITS) ×2 IMPLANT
KWIRE 1.6X150 (WIRE) IMPLANT
NDL SUT 6 .5 CRC .975X.05 MAYO (NEEDLE) ×2 IMPLANT
NEEDLE MAYO TAPER (NEEDLE) ×2
NS IRRIG 1000ML POUR BTL (IV SOLUTION) ×2 IMPLANT
PACK TOTAL JOINT (CUSTOM PROCEDURE TRAY) ×2 IMPLANT
PAD ARMBOARD 7.5X6 YLW CONV (MISCELLANEOUS) ×4 IMPLANT
PAD CAST 4YDX4 CTTN HI CHSV (CAST SUPPLIES) ×2 IMPLANT
PADDING CAST COTTON 4X4 STRL (CAST SUPPLIES) ×1
PADDING CAST COTTON 6X4 STRL (CAST SUPPLIES) ×2 IMPLANT
PLATE PROX TIB LT 3.5 VA-LCP (Plate) IMPLANT
SCREW CORTEX 3.5X75MM (Screw) IMPLANT
SCREW LOCK CORT ST 3.5X26 (Screw) IMPLANT
SCREW LOCK CORT ST 3.5X36 (Screw) IMPLANT
SCREW LOCKING SLF TAP 3.5X70MM (Screw) IMPLANT
STAPLER VISISTAT 35W (STAPLE) ×2 IMPLANT
STRIP CLOSURE SKIN 1/2X4 (GAUZE/BANDAGES/DRESSINGS) IMPLANT
SUCTION TUBE FRAZIER 10FR DISP (SUCTIONS) ×2 IMPLANT
SUT ETHILON 2 0 FS 18 (SUTURE) ×2 IMPLANT
SUT ETHILON 3 0 PS 1 (SUTURE) IMPLANT
SUT FIBERWIRE #2 38 T-5 BLUE (SUTURE)
SUT MNCRL AB 3-0 PS2 27 (SUTURE) IMPLANT
SUT VIC AB 0 CT1 27 (SUTURE)
SUT VIC AB 0 CT1 27XBRD ANBCTR (SUTURE) IMPLANT
SUT VIC AB 0 CTX 27 (SUTURE) IMPLANT
SUT VIC AB 1 CT1 18XCR BRD 8 (SUTURE) IMPLANT
SUT VIC AB 1 CT1 27 (SUTURE) ×1
SUT VIC AB 1 CT1 27XBRD ANBCTR (SUTURE) ×2 IMPLANT
SUT VIC AB 1 CT1 8-18 (SUTURE)
SUT VIC AB 2-0 CT1 27 (SUTURE) ×2
SUT VIC AB 2-0 CT1 TAPERPNT 27 (SUTURE) ×4 IMPLANT
SUTURE FIBERWR #2 38 T-5 BLUE (SUTURE) IMPLANT
TOWEL GREEN STERILE (TOWEL DISPOSABLE) ×4 IMPLANT
TRAY FOLEY MTR SLVR 16FR STAT (SET/KITS/TRAYS/PACK) IMPLANT
WATER STERILE IRR 1000ML POUR (IV SOLUTION) ×4 IMPLANT

## 2022-10-10 NOTE — Transfer of Care (Signed)
Immediate Anesthesia Transfer of Care Note  Patient: Kristen Smith  Procedure(s) Performed: OPEN REDUCTION INTERNAL FIXATION (ORIF) TIBIAL PLATEAU (Left)  Patient Location: PACU  Anesthesia Type:Spinal  Level of Consciousness: awake, alert , and oriented  Airway & Oxygen Therapy: Patient Spontanous Breathing  Post-op Assessment: Report given to RN  Post vital signs: Reviewed and stable  Last Vitals:  Vitals Value Taken Time  BP 114/49 10/10/22 1004  Temp    Pulse 73 10/10/22 1007  Resp 19 10/10/22 1007  SpO2 86 % 10/10/22 1007  Vitals shown include unvalidated device data.  Last Pain:  Vitals:   10/10/22 0725  TempSrc:   PainSc: 0-No pain         Complications: No notable events documented.

## 2022-10-10 NOTE — Op Note (Addendum)
Orthopaedic Surgery Operative Note (CSN: [id number] ) Date of Surgery: 10/10/2022  Admit Date: 10/10/2022   Diagnoses: Pre-Op Diagnoses: Left lateral tibial plateau fracture  Post-Op Diagnosis: Same  Procedures: CPT 27535-Open reduction internal fixation of left lateral tibial plateau fracture  Surgeons : Primary: Roby Lofts, MD  Assistant: Ulyses Southward, PA-C  Location: OR 3   Anesthesia: Regional with sedation   Antibiotics: Ancef 2g preop with 1 gm vancomycin powder placed topically   Tourniquet time: * Missing tourniquet times found for documented tourniquets in log: [id number] * Total Tourniquet Time Documented: Thigh (Left) - 30 minutes Total: Thigh (Left) - 30 minutes     Estimated Blood Loss: Minimal  Complications: None   Specimens: None   Implants: Implant Name Type Inv. Item Serial No. Manufacturer Lot No. LRB No. Used Action  BONE CANC CHIPS 20CC PCAN1/4 - [id number] Bone Implant BONE [facility] CHIPS [facility] PCAN1/4 [id number] [facility]  Left 1 Implanted  PLATE PROX TIB LT 3.5 VA-LCP - [id number] Plate PLATE PROX TIB LT 3.5 VA-LCP  [employer]  Left 1 Implanted  SCREW CORTEX 3.5X75MM - [id number] Screw SCREW CORTEX 3.5X75MM  [employer]  Left 1 Implanted  SCREW LOCK CORT ST 3.5X26 - [id number] Screw SCREW LOCK CORT ST 3.5X26  [employer]PU[employer]  Left 1 Implanted  SCREW LOCK CORT ST 3.5X36 - [id number] Screw SCREW LOCK CORT ST 3.5X36  [employer]PU[employer]  Left 1 Implanted  SCREW LOCKING SLF TAP 3.5X70MM - [id number] Screw SCREW LOCKING SLF TAP 3.5X70MM  [employer]  Left 2 Implanted     Indications for Surgery: 82 year old female who sustained a ground-level fall with a left lateral tibial plateau fracture.  Due to the highly displaced joint of the posterior lateral plateau I recommended pursuing surgical intervention after risks and benefits of nonoperative management were discussed.  Risks included but not limited to bleeding, infection,  malunion, nonunion, hardware failure, hardware irritation, nerve or blood vessel injury, posttraumatic arthritis, knee stiffness, even the possibility anesthetic complications.  They agreed to proceed with surgery and consent was obtained.  Operative Findings: 1.  Open reduction internal fixation of left lateral tibial plateau fracture using Synthes VA 3.5 mm proximal tibial locking plate with backfilling of the metaphyseal void using crushed cancellous allograft. 2.  No peripheral meniscus tear noted on submeniscal arthrotomy intraoperatively  Procedure: The patient was identified in the preoperative holding area. Consent was confirmed with the patient and their family and all questions were answered. The operative extremity was marked after confirmation with the patient. she was then brought back to the operating room by our anesthesia colleagues.  She was carefully transferred over to radiolucent flattop table.  She was placed under anesthetic.  A bump was placed under her operative hip.  A nonsterile tourniquet was placed to the upper thigh.  The left lower extremity was then prepped and draped in usual sterile fashion.  A timeout was performed to verify the patient, the procedure, and the extremity.  Preoperative antibiotics were dosed.  Hip and knee were flexed over a triangle and fluoroscopic imaging showed the unstable nature of her injury.  The tourniquet was inflated to 250 mmHg.  Total tourniquet time as noted above.  A lateral approach to the proximal tibia was carried down through skin and subcutaneous tissue.  I incised through the IT band and released it off of the lateral condyle of the proximal tibia.  I then developed the interval between the capsule and the IT band.  I then  released the IT band all the way back until I could palpate the fibular head.  I then performed a submeniscal arthrotomy with a 15 blade and tagged the capsule for later repair with a 0 Vicryl suture.  Here I did not  appreciate any meniscus tear on the lateral joint.  I was able to visualize the impacted articular surface.  At this point I then created a window in the lateral cortex using a 2.5 mm drill bit and a osteotome and used a footed tamp to disimpact the articular surface.  I was able to get it in an anatomic position and was able to visualize this both with the fluoroscopic imaging and then our submeniscal arthrotomy.  The metaphyseal void that was left from disimpaction I used crushed cancellous allograft backfill this.  I then held the articular reduction with 1.6 mm K wires.  I then positioned a Synthes VA 3.5 mm proximal tibial locking plate and slid this submuscularly along the lateral cortex of the tibia.  I then drilled and placed a 3.5 millimeter screw proximally to bring the plate flush to bone.  I then drilled and placed 2 cortical screws in the tibial shaft to bring the distal portion of the plate flush to bone.  I then returned to the proximal segment and placed 2 more locking screws to raft the posterior articular surface.  Final fluoroscopic imaging was obtained.  The incisions were copiously irrigated.  Free needles were used to bring the tag sutures for the capsule through the plate and tied these down.  A gram of vancomycin powder was placed into the incision.  Layered closure of 0 Vicryl, 2-0 Vicryl and 3-0 Monocryl with Steri-Strips were used to close the skin.  Sterile dressing was applied.  The patient was then awoken from anesthesia and taken to the PACU in stable condition.  Post Op Plan/Instructions: The patient be touchdown weightbearing to the left lower extremity.  She will receive postoperative Ancef.  She will receive aspirin for DVT prophylaxis on discharge and Lovenox while inpatient.  We will have her mobilize with physical therapy and Occupational Therapy.  I was present and performed the entire surgery.  Ulyses Southward, PA-C did assist me throughout the case. An assistant was  necessary given the difficulty in approach, maintenance of reduction and ability to instrument the fracture.   Truitt Merle, MD [facility]

## 2022-10-10 NOTE — Anesthesia Postprocedure Evaluation (Signed)
Anesthesia Post Note  Patient: Kristen Smith  Procedure(s) Performed: OPEN REDUCTION INTERNAL FIXATION (ORIF) TIBIAL PLATEAU (Left)     Patient location during evaluation: PACU Anesthesia Type: MAC Level of consciousness: awake and alert Pain management: pain level controlled Vital Signs Assessment: post-procedure vital signs reviewed and stable Respiratory status: spontaneous breathing, nonlabored ventilation, respiratory function stable and patient connected to nasal cannula oxygen Cardiovascular status: stable and blood pressure returned to baseline Postop Assessment: no apparent nausea or vomiting Anesthetic complications: no   No notable events documented.  Last Vitals:  Vitals:   10/10/22 1045 10/10/22 1100  BP: 139/78 139/64  Pulse: 62 65  Resp: 19 18  Temp:    SpO2: 99% 100%    Last Pain:  Vitals:   10/10/22 1100  TempSrc:   PainSc: 7                  Rozalia Dino

## 2022-10-10 NOTE — Interval H&P Note (Signed)
History and Physical Interval Note:  10/10/2022 8:17 AM  Kristen Smith  has presented today for surgery, with the diagnosis of Left tibial plateau fracture.  The various methods of treatment have been discussed with the patient and family. After consideration of risks, benefits and other options for treatment, the patient has consented to  Procedure(s): OPEN REDUCTION INTERNAL FIXATION (ORIF) TIBIAL PLATEAU (Left) as a surgical intervention.  The patient's history has been reviewed, patient examined, no change in status, stable for surgery.  I have reviewed the patient's chart and labs.  Questions were answered to the patient's satisfaction.     Caryn Bee P Davanna He

## 2022-10-10 NOTE — Plan of Care (Signed)

## 2022-10-11 ENCOUNTER — Encounter (HOSPITAL_COMMUNITY): Payer: Self-pay | Admitting: Student

## 2022-10-11 LAB — CBC
HCT: 35 % — ABNORMAL LOW (ref 36.0–46.0)
Hemoglobin: 11.7 g/dL — ABNORMAL LOW (ref 12.0–15.0)
MCH: 30.2 pg (ref 26.0–34.0)
MCHC: 33.4 g/dL (ref 30.0–36.0)
MCV: 90.4 fL (ref 80.0–100.0)
Platelets: 283 10*3/uL (ref 150–400)
RBC: 3.87 MIL/uL (ref 3.87–5.11)
RDW: 13.1 % (ref 11.5–15.5)
WBC: 8.3 10*3/uL (ref 4.0–10.5)
nRBC: 0 % (ref 0.0–0.2)

## 2022-10-11 LAB — BASIC METABOLIC PANEL
Anion gap: 9 (ref 5–15)
BUN: 7 mg/dL — ABNORMAL LOW (ref 8–23)
CO2: 24 mmol/L (ref 22–32)
Calcium: 8.3 mg/dL — ABNORMAL LOW (ref 8.9–10.3)
Chloride: 96 mmol/L — ABNORMAL LOW (ref 98–111)
Creatinine, Ser: 0.73 mg/dL (ref 0.44–1.00)
GFR, Estimated: >60 mL/min (ref >60)
Glucose, Bld: 128 mg/dL — ABNORMAL HIGH (ref 70–99)
Potassium: 4 mmol/L (ref 3.5–5.1)
Sodium: 129 mmol/L — ABNORMAL LOW (ref 135–145)

## 2022-10-11 MED ORDER — SODIUM CHLORIDE 1 G PO TABS
1.0000 g | ORAL_TABLET | Freq: Two times a day (BID) | ORAL | Status: DC
  Administered 2022-10-11: 1 g via ORAL
  Filled 2022-10-11: qty 1

## 2022-10-11 MED ORDER — ASPIRIN 325 MG PO TBEC: 325.0000 mg | DELAYED_RELEASE_TABLET | Freq: Every day | ORAL | 0 refills | Status: AC

## 2022-10-11 MED ORDER — VITAMIN D 25 MCG (1000 UNIT) PO TABS
1000.0000 [IU] | ORAL_TABLET | Freq: Every day | ORAL | Status: DC
  Administered 2022-10-11: 1000 [IU] via ORAL
  Filled 2022-10-11: qty 1

## 2022-10-11 MED ORDER — ACETAMINOPHEN 500 MG PO TABS
1000.0000 mg | ORAL_TABLET | Freq: Four times a day (QID) | ORAL | Status: DC | PRN
  Administered 2022-10-11: 1000 mg via ORAL
  Filled 2022-10-11: qty 2

## 2022-10-11 MED ORDER — VITAMIN D3 25 MCG PO TABS: 1000.0000 [IU] | ORAL_TABLET | Freq: Every day | ORAL | 2 refills | Status: AC

## 2022-10-11 MED ORDER — KETOROLAC TROMETHAMINE 15 MG/ML IJ SOLN
15.0000 mg | Freq: Four times a day (QID) | INTRAMUSCULAR | Status: DC | PRN
  Administered 2022-10-11: 15 mg via INTRAVENOUS
  Filled 2022-10-11: qty 1

## 2022-10-11 NOTE — Evaluation (Signed)
Physical Therapy Evaluation Patient Details Name: Kristen Smith MRN: [id number] DOB: 1940/08/29 Today's Date: 10/11/2022  History of Present Illness  Pt is a 82 y.o. female s/p L ORIF tibial plateau 10/10/2022. 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  Clinical Impression   Pt presents with generalized weakness, impaired balance, min LLE pain post-op, and decreased activity tolerance. Pt to benefit from acute PT to address deficits. Pt ambulated short room distance with use of RW and max sequencing cues for maintenance of WB precautions. Pt's daughter present and understands cuing, precautions, and plans to be with pt initially at d/c. Exercise handout administered and practiced with pt, pt expresses good understanding. PT to progress mobility as tolerated, and will continue to follow acutely.         Recommendations for follow up therapy are one component of a multi-disciplinary discharge planning process, led by the attending physician.  Recommendations may be updated based on patient status, additional functional criteria and insurance authorization.  Follow Up Recommendations       Assistance Recommended at Discharge Frequent or constant Supervision/Assistance  Patient can return home with the following  A little help with walking and/or transfers;A little help with bathing/dressing/bathroom    Equipment Recommendations None recommended by PT  Recommendations for Other Services       Functional Status Assessment Patient has had a recent decline in their functional status and demonstrates the ability to make significant improvements in function in a reasonable and predictable amount of time.     Precautions / Restrictions Precautions Precautions: Fall Restrictions Weight Bearing Restrictions: Yes LLE Weight Bearing: Touchdown weight bearing      Mobility  Bed Mobility Overal bed mobility: Needs Assistance Bed Mobility:  Supine to Sit     Supine to sit: Supervision     General bed mobility comments: safety, increased time    Transfers Overall transfer level: Needs assistance Equipment used: Rolling walker (2 wheels) Transfers: Sit to/from Stand Sit to Stand: Min guard           General transfer comment: for safety, cues for safe hand placement when rising/sitting. Stand x2, from EOB and BSC.    Ambulation/Gait Ambulation/Gait assistance: Min guard Gait Distance (Feet): 20 Feet Assistive device: Rolling walker (2 wheels) Gait Pattern/deviations: Step-to pattern, Decreased weight shift to left, Trunk flexed Gait velocity: decr     General Gait Details: for safety, mod-max sequencing cues given WB precautions  Stairs            Wheelchair Mobility    Modified Rankin (Stroke Patients Only)       Balance Overall balance assessment: Needs assistance Sitting-balance support: No upper extremity supported, Feet supported Sitting balance-Leahy Scale: Fair     Standing balance support: Bilateral upper extremity supported, During functional activity, Reliant on assistive device for balance Standing balance-Leahy Scale: Poor                               Pertinent Vitals/Pain Pain Assessment Pain Assessment: Faces Faces Pain Scale: Hurts a little bit Pain Location: LLE Pain Descriptors / Indicators: Sore Pain Intervention(s): Limited activity within patient's tolerance, Monitored during session, Repositioned    Home Living Family/patient expects to be discharged to:: Private residence Living Arrangements: Spouse/significant other Available Help at Discharge: Family;Available 24 hours/day Type of Home: House Home Access: Level entry       Home Layout: One  level Home Equipment: Toilet riser;Shower Counsellor (2 wheels);Cane - single point;Transport chair      Prior Function Prior Level of Function : Independent/Modified Independent              Mobility Comments: has been using transport chair and transfer-level mobility with assist of her duaghter since initial injury on 6/8, typically independent with mobility       Hand Dominance   Dominant Hand: Right    Extremity/Trunk Assessment   Upper Extremity Assessment Upper Extremity Assessment: Defer to OT evaluation    Lower Extremity Assessment Lower Extremity Assessment: Generalized weakness;LLE deficits/detail LLE Deficits / Details: able to perform heel slide, quad set, limited ROM SLR, ankle pumps    Cervical / Trunk Assessment Cervical / Trunk Assessment: Normal  Communication   Communication: No difficulties  Cognition Arousal/Alertness: Awake/alert Behavior During Therapy: WFL for tasks assessed/performed Overall Cognitive Status: Within Functional Limits for tasks assessed Area of Impairment: Safety/judgement, Following commands                       Following Commands: Follows one step commands with increased time Safety/Judgement: Decreased awareness of deficits, Decreased awareness of safety     General Comments: requires repeated cuing and frequent sequencing assist given TTWB precautions        General Comments      Exercises General Exercises - Lower Extremity Ankle Circles/Pumps: AROM, Both, 10 reps, Seated Quad Sets: AROM, Left, 10 reps, Seated Heel Slides: AROM, Left, 5 reps, Seated   Assessment/Plan    PT Assessment Patient needs continued PT services  PT Problem List Decreased strength;Decreased mobility;Decreased balance;Decreased activity tolerance;Decreased knowledge of use of DME;Pain;Decreased range of motion;Decreased safety awareness;Decreased knowledge of precautions       PT Treatment Interventions DME instruction;Therapeutic activities;Gait training;Therapeutic exercise;Patient/family education;Balance training;Functional mobility training;Neuromuscular re-education    PT Goals (Current goals can be found in the  Care Plan section)  Acute Rehab PT Goals Patient Stated Goal: home PT Goal Formulation: With patient Time For Goal Achievement: 10/25/22 Potential to Achieve Goals: Good    Frequency Min 4X/week     Co-evaluation               AM-PAC PT "6 Clicks" Mobility  Outcome Measure Help needed turning from your back to your side while in a flat bed without using bedrails?: A Little Help needed moving from lying on your back to sitting on the side of a flat bed without using bedrails?: A Little Help needed moving to and from a bed to a chair (including a wheelchair)?: A Little Help needed standing up from a chair using your arms (e.g., wheelchair or bedside chair)?: A Little Help needed to walk in hospital room?: A Little Help needed climbing 3-5 steps with a railing? : A Little 6 Click Score: 18    End of Session   Activity Tolerance: Patient tolerated treatment well Patient left: in chair;with call bell/phone within reach;with family/visitor present;Other (comment) (OT arrived to room, chair alarm pad placed) Nurse Communication: Mobility status PT Visit Diagnosis: Other abnormalities of gait and mobility (R26.89);Muscle weakness (generalized) (M62.81)    Time: 1610-9604 PT Time Calculation (min) (ACUTE ONLY): 35 min   Charges:   PT Evaluation $PT Eval Low Complexity: 1 Low PT Treatments $Therapeutic Activity: 8-22 mins        Marye Round, PT DPT Acute Rehabilitation Services Secure Chat Preferred  Office [phone]   Kristen Smith 10/11/2022, 11:29 AM

## 2022-10-11 NOTE — Discharge Instructions (Addendum)
[facility] Discharge Instructions   General Discharge Instructions  WEIGHT BEARING STATUS:Touchdown weightbearing left lower extremity  RANGE OF MOTION/ACTIVITY:Ok for unrestricted range of motion of the left knee. You are ok to place a pillow under your mid calf/ankle but do not place anything behind the knee itself. You may sleep in any position that is comfortable  Wound Care: You may remove your surgical dressing on post-op day 2, (Friday 10/12/22). Leave the white steri-strips in place. Incisions can be left open to air if there is no drainage. Showering may begin post op day 3, (Saturday 10/13/22).  Clean incision gently with soap and water. Once out of the shower, pat the incision dry and let it air our  DVT/PE prophylaxis: Aspirin 325 mg daily x 30 days  Diet: as you were eating previously.  Can use over the counter stool softeners and bowel preparations, such as Miralax, to help with bowel movements.  Narcotics can be constipating.  Be sure to drink plenty of fluids  PAIN MEDICATION USE AND EXPECTATIONS  You have likely been given narcotic medications to help control your pain.  After a traumatic event that results in an fracture (broken bone) with or without surgery, it is ok to use narcotic pain medications to help control one's pain.  We understand that everyone responds to pain differently and each individual patient will be evaluated on a regular basis for the continued need for narcotic medications. Ideally, narcotic medication use should last no more than 6-8 weeks (coinciding with fracture healing).   As a patient it is your responsibility as well to monitor narcotic medication use and report the amount and frequency you use these medications when you come to your office visit.   We would also advise that if you are using narcotic medications, you should take a dose prior to therapy to maximize you participation.  IF YOU ARE ON NARCOTIC MEDICATIONS IT IS NOT  PERMISSIBLE TO OPERATE A MOTOR VEHICLE (MOTORCYCLE/CAR/TRUCK/MOPED) OR HEAVY MACHINERY DO NOT MIX NARCOTICS WITH OTHER CNS (CENTRAL NERVOUS SYSTEM) DEPRESSANTS SUCH AS ALCOHOL   STOP SMOKING OR USING NICOTINE PRODUCTS!!!!  As discussed nicotine severely impairs your body's ability to heal surgical and traumatic wounds but also impairs bone healing.  Wounds and bone heal by forming microscopic blood vessels (angiogenesis) and nicotine is a vasoconstrictor (essentially, shrinks blood vessels).  Therefore, if vasoconstriction occurs to these microscopic blood vessels they essentially disappear and are unable to deliver necessary nutrients to the healing tissue.  This is one modifiable factor that you can do to dramatically increase your chances of healing your injury.    (This means no smoking, no nicotine gum, patches, etc)  ICE AND ELEVATE INJURED/OPERATIVE EXTREMITY  Using ice and elevating the injured extremity above your heart can help with swelling and pain control.  Icing in a pulsatile fashion, such as 20 minutes on and 20 minutes off, can be followed.    Do not place ice directly on skin. Make sure there is a barrier between to skin and the ice pack.    Using frozen items such as frozen peas works well as the conform nicely to the are that needs to be iced.  USE AN ACE WRAP OR TED HOSE FOR SWELLING CONTROL  In addition to icing and elevation, Ace wraps or TED hose are used to help limit and resolve swelling.  It is recommended to use Ace wraps or TED hose until you are informed to stop.  When using Ace Wraps start the wrapping distally (farthest away from the body) and wrap proximally (closer to the body)   Example: If you had surgery on your leg or thing and you do not have a splint on, start the ace wrap at the toes and work your way up to the thigh   CALL THE OFFICE WITH ANY QUESTIONS OR CONCERNS: [phone]   VISIT OUR WEBSITE FOR ADDITIONAL INFORMATION:  [email or web address]    Discharge Wound Care Instructions  Do NOT apply any ointments, solutions or lotions to pin sites or surgical wounds.  These prevent needed drainage and even though solutions like hydrogen peroxide kill bacteria, they also damage cells lining the pin sites that help fight infection.  Applying lotions or ointments can keep the wounds moist and can cause them to breakdown and open up as well. This can increase the risk for infection. When in doubt call the office.  If any drainage is noted, use one layer of adaptic or Mepitel, then gauze, Kerlix, and an ace wrap. - These dressing supplies should be available at local medical supply stores [facility], [facility], etc) as well as Insurance claims handler ([facility], [facility], [place], etc)

## 2022-10-11 NOTE — TOC Initial Note (Addendum)
Transition of Care [facility]) - Initial/Assessment Note    Patient Details  Name: Kristen Smith MRN: [id number] Date of Birth: 26-Nov-1940  Transition of Care [facility]) CM/SW Contact:    Janae Bridgeman, RN Phone Number: 10/11/2022, 11:18 AM  Clinical Narrative:                 CM met with the patient at the bedside to discuss TOC needs.  The patient is Post-Op s/p OPEN REDUCTION INTERNAL FIXATION LEFT TIBIAL PLATEAU.  The patient lives with her husband at the home who is unable to provide assistance to the patient.  Daughter is in the hospital room and plans to assist with care at the home until she is able to find 24 hour care in the home.  I spoke with the patient and offered Medicare choice regarding home health services and the patient prefers [facility] [place].  I called and was unable to reach a triage nurse but left a detailed message on the phone requesting services.  The patient has raised toilet seat and RW at the home.  The patient's daughter was provided with numerous private pay home health agencies to call to coordinate extra supervision/care in the home.  The patient can be discharged home with daughter today by bedside nursing.  6/20[facility] was contacted and they are unable to offer services due to staffing issues.  I called Andria Frames, RNCM accepted the referral.  Daughter was updated.  Expected Discharge Plan: Home w Home Health Services Barriers to Discharge: No Barriers Identified   Patient Goals and CMS Choice Patient states their goals for this hospitalization and ongoing recovery are:: To return home with family CMS Medicare.gov Compare Post Acute Care list provided to:: Patient Choice offered to / list presented to : Patient [place] ownership interest in [facility].provided to:: Patient    Expected Discharge Plan and Services   Discharge Planning Services: CM Consult Post Acute Care Choice: Home Health Living  arrangements for the past 2 months: Single Family Home Expected Discharge Date: 10/11/22                         HH Arranged: PT, OT, Nurse's Aide HH Agency: Other - See comment [facility] health) Date [facility] Agency Contacted: 10/11/22 Time HH Agency Contacted: [phone] Representative spoke with at [facility] Agency: Left voicemail with [facility] health  Prior Living Arrangements/Services Living arrangements for the past 2 months: Single Family Home Lives with:: Spouse Patient language and need for interpreter reviewed:: Yes Do you feel safe going back to the place where you live?: Yes      Need for Family Participation in Patient Care: Yes (Comment) Care giver support system in place?: Yes (comment) Current home services: DME (RW and raised toilet seat in home) Criminal Activity/Legal Involvement Pertinent to Current Situation/Hospitalization: No - Comment as needed  Activities of Daily Living   ADL Screening (condition at time of admission) Patient's cognitive ability adequate to safely complete daily activities?: Yes Is the patient deaf or have difficulty hearing?: No Does the patient have difficulty seeing, even when wearing glasses/contacts?: No Does the patient have difficulty concentrating, remembering, or making decisions?: No Patient able to express need for assistance with ADLs?: Yes Does the patient have difficulty dressing or bathing?: Yes Independently performs ADLs?: Yes (appropriate for developmental age) Does the patient have difficulty walking or climbing stairs?: Yes Weakness of Legs: Both  Weakness of Arms/Hands: None  Permission Sought/Granted Permission sought to share information with : Case Manager, Magazine features editor, Family Supports Permission granted to share information with : Yes, Verbal Permission Granted     Permission granted to share info w AGENCY: [facility] - referral placed  Permission granted to  share info w Relationship: daughter     Emotional Assessment Appearance:: Appears stated age Attitude/Demeanor/Rapport: Gracious Affect (typically observed): Accepting Orientation: : Oriented to Self, Oriented to Place, Oriented to  Time, Oriented to Situation Alcohol / Substance Use: Not Applicable Psych Involvement: No (comment)  Admission diagnosis:  Closed fracture of left tibial plateau [S82.142A] Patient Active Problem List   Diagnosis Date Noted   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   Essential hypertension 09/05/2021   Hyperlipidemia 09/05/2021   Preoperative clearance 09/05/2021   Post-operative state 05/30/2012   PCP:  Krystal Clark, NP Pharmacy:   [facility], [place], [place] - [address] [place] [address] Phone: [phone] Fax: [phone]  [facility] - [place], [place] - [address] [place] [address] Phone: [phone] Fax: [phone]  [facility] [address] - [place], [place] - [address] [place] [address] Phone: [phone] Fax: [phone]     Social Determinants of Health (SDOH) Social History: SDOH Screenings   Tobacco Use: Low Risk  (10/11/2022)   SDOH Interventions:     Readmission Risk Interventions    10/11/2022   11:17 AM  Readmission Risk Prevention Plan  Post Dischage Appt Complete  Medication Screening Complete  Transportation Screening Complete

## 2022-10-11 NOTE — Evaluation (Signed)
Occupational Therapy Evaluation Patient Details Name: Kristen Smith MRN: [id number] DOB: 06-07-1940 Today's Date: 10/11/2022   History of Present Illness Pt is a 82 y.o. female s/p L ORIF tibial plateau 10/10/2022. 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   Clinical Impression   PTA, pt recently receiving assist and using wheelchair for mobility since injurt. Upon eval, pt performing UB ADL with set-up and LB ADL with mod A. Pt educated and demonstrating use of compensatory techniques LB ADL, grooming, and shower transfers within precautions. Recommending discharge home with family to assist as needed as well as HHOT and HH aide. Will continue to follow acutely.        Recommendations for follow up therapy are one component of a multi-disciplinary discharge planning process, led by the attending physician.  Recommendations may be updated based on patient status, additional functional criteria and insurance authorization.   Assistance Recommended at Discharge Intermittent Supervision/Assistance  Patient can return home with the following A little help with walking and/or transfers;A little help with bathing/dressing/bathroom;Help with stairs or ramp for entrance;Assist for transportation;Assistance with cooking/housework    Functional Status Assessment  Patient has had a recent decline in their functional status and demonstrates the ability to make significant improvements in function in a reasonable and predictable amount of time.  Equipment Recommendations  None recommended by OT    Recommendations for Other Services       Precautions / Restrictions Precautions Precautions: Fall Restrictions Weight Bearing Restrictions: Yes LLE Weight Bearing: Touchdown weight bearing      Mobility Bed Mobility               General bed mobility comments: in recliner on arirval and departure    Transfers Overall transfer level:  Needs assistance Equipment used: Rolling walker (2 wheels) Transfers: Sit to/from Stand Sit to Stand: Min guard           General transfer comment: for safety and cues initially for safe hand placement esp when rising from differing surfaces      Balance Overall balance assessment: Needs assistance Sitting-balance support: No upper extremity supported, Feet supported Sitting balance-Leahy Scale: Fair     Standing balance support: Bilateral upper extremity supported, During functional activity, Reliant on assistive device for balance Standing balance-Leahy Scale: Poor                             ADL either performed or assessed with clinical judgement   ADL Overall ADL's : Needs assistance/impaired Eating/Feeding: Modified independent;Sitting   Grooming: Set up;Sitting   Upper Body Bathing: Set up;Sitting   Lower Body Bathing: Minimal assistance;Sit to/from stand   Upper Body Dressing : Set up;Sitting   Lower Body Dressing: Moderate assistance;Sit to/from stand Lower Body Dressing Details (indicate cue type and reason): daughter indep assisting. Reviewed compensatory techniques and AE that can be obtained to optimize independence as well. Toilet Transfer: Min guard;Rolling walker (2 wheels);Cueing for sequencing Toilet Transfer Details (indicate cue type and reason): cues for maintenance of precautions, RW, L foot, R foot sequence. Toileting- Clothing Manipulation and Hygiene: Supervision/safety;Sitting/lateral lean   Tub/ Shower Transfer: Walk-in shower;Minimal assistance;Ambulation;Shower seat;Rolling walker (2 wheels) Tub/Shower Transfer Details (indicate cue type and reason): min guard into shower with setp by step cues for sequencing/new leanring. Pt rather impulsive with exit and attempting to hop out over lip of shower. Min A to maintain balance and steady Functional  mobility during ADLs: Min guard;Rolling walker (2 wheels) General ADL Comments: incr need  for cues with fatigue     Vision Baseline Vision/History: 1 Wears glasses Ability to See in Adequate Light: 0 Adequate Patient Visual Report: No change from baseline Vision Assessment?: No apparent visual deficits     Perception     Praxis      Pertinent Vitals/Pain Pain Assessment Pain Assessment: Faces Faces Pain Scale: Hurts a little bit Pain Location: LLE Pain Descriptors / Indicators: Sore Pain Intervention(s): Limited activity within patient's tolerance     Hand Dominance Right   Extremity/Trunk Assessment Upper Extremity Assessment Upper Extremity Assessment: Overall WFL for tasks assessed   Lower Extremity Assessment Lower Extremity Assessment: Defer to PT evaluation LLE Deficits / Details: able to perform heel slide, quad set, limited ROM SLR, ankle pumps   Cervical / Trunk Assessment Cervical / Trunk Assessment: Normal   Communication Communication Communication: No difficulties   Cognition Arousal/Alertness: Awake/alert Behavior During Therapy: WFL for tasks assessed/performed Overall Cognitive Status: Within Functional Limits for tasks assessed Area of Impairment: Safety/judgement, Following commands                       Following Commands: Follows one step commands with increased time Safety/Judgement: Decreased awareness of deficits, Decreased awareness of safety     General Comments: requires repeated cuing and frequent sequencing assist given TTWB precautions     General Comments  daughter present and also receptive to eductaion. Able to independently cue patient as needed    Exercises     Shoulder Instructions      Home Living Family/patient expects to be discharged to:: Private residence Living Arrangements: Spouse/significant other Available Help at Discharge: Family;Available 24 hours/day Type of Home: House Home Access: Level entry     Home Layout: One level     Bathroom Shower/Tub: Producer, television/film/video:  Standard     Home Equipment: Toilet riser;Shower Counsellor (2 wheels);Cane - single point;Transport chair          Prior Functioning/Environment Prior Level of Function : Independent/Modified Independent             Mobility Comments: has been using transport chair and transfer-level mobility with assist of her duaghter since initial injury on 6/8, typically independent with mobility          OT Problem List: Decreased strength;Decreased activity tolerance;Impaired balance (sitting and/or standing);Decreased knowledge of use of DME or AE;Decreased knowledge of precautions;Decreased safety awareness;Decreased cognition      OT Treatment/Interventions: Self-care/ADL training;Therapeutic exercise;DME and/or AE instruction;Balance training;Patient/family education;Therapeutic activities    OT Goals(Current goals can be found in the care plan section) Acute Rehab OT Goals Patient Stated Goal: go home OT Goal Formulation: With patient/family Time For Goal Achievement: 10/25/22 Potential to Achieve Goals: Good  OT Frequency: Min 2X/week    Co-evaluation              AM-PAC OT "6 Clicks" Daily Activity     Outcome Measure Help from another person eating meals?: None Help from another person taking care of personal grooming?: A Little Help from another person toileting, which includes using toliet, bedpan, or urinal?: A Little Help from another person bathing (including washing, rinsing, drying)?: A Little Help from another person to put on and taking off regular upper body clothing?: A Little Help from another person to put on and taking off regular lower body clothing?: A Little 6 Click Score:  19   End of Session Equipment Utilized During Treatment: Gait belt;Rolling walker (2 wheels) Nurse Communication: Mobility status  Activity Tolerance: Patient tolerated treatment well Patient left: in chair;with call bell/phone within reach;with family/visitor  present;Other (comment) (CM in room)  OT Visit Diagnosis: Unsteadiness on feet (R26.81);Muscle weakness (generalized) (M62.81);Other symptoms and signs involving cognitive function                Time: 1024-1056 OT Time Calculation (min): 32 min Charges:  OT General Charges $OT Visit: 1 Visit OT Evaluation $OT Eval Low Complexity: 1 Low OT Treatments $Self Care/Home Management : 8-22 mins  Tyler Deis, OTR/L [facility] Acute Rehabilitation Office: [phone]   Myrla Halsted 10/11/2022, 1:04 PM

## 2022-10-11 NOTE — Progress Notes (Addendum)
Orthopaedic Trauma Progress Note  SUBJECTIVE: Doing well this morning. Pain controlled. Denies any numbness or tingling throughout the leg. No chest pain. No SOB. No nausea/vomiting. No other complaints. Daughter at bedside. She is asking about [facility] PT as well as a HH aide to assist with ADLs. Both patient/daughter hopeful for discharge home today. Discussed hyponatremia with patient/daughter this morning, patient remains asymptomatic. Will address while here in the hospital and daughter plans to continue treating at home.  OBJECTIVE:  Vitals:   10/11/22 0459 10/11/22 0756  BP: 136/66 119/77  Pulse: 74 72  Resp: 18 17  Temp: 98.1 F (36.7 C) 97.6 F (36.4 C)  SpO2: 98% 97%    General: Sitting up in bed, NAD Respiratory: No increased work of breathing.  Left lower extremity: Dressing clean, dry, intact.  Tenderness about the knee as expected.  No significant calf tenderness.  Tolerates gentle knee and ankle ROM.  Able to wiggle toes.  Compartments soft and compressible. + DP pulse  IMAGING: Stable post op imaging.   LABS:  Results for orders placed or performed during the hospital encounter of 10/10/22 (from the past 24 hour(s))  VITAMIN D 25 Hydroxy (Vit-D Deficiency, Fractures)     Status: Abnormal   Collection Time: 10/10/22  3:53 PM  Result Value Ref Range   Vit D, 25-Hydroxy 23.50 (L) 30 - 100 ng/mL  CBC     Status: Abnormal   Collection Time: 10/11/22  3:18 AM  Result Value Ref Range   WBC 8.3 4.0 - 10.5 K/uL   RBC 3.87 3.87 - 5.11 MIL/uL   Hemoglobin 11.7 (L) 12.0 - 15.0 g/dL   HCT 16.1 (L) 09.6 - 04.5 %   MCV 90.4 80.0 - 100.0 fL   MCH 30.2 26.0 - 34.0 pg   MCHC 33.4 30.0 - 36.0 g/dL   RDW 40.9 81.1 - 91.4 %   Platelets 283 150 - 400 K/uL   nRBC 0.0 0.0 - 0.2 %  Basic metabolic panel     Status: Abnormal   Collection Time: 10/11/22  3:18 AM  Result Value Ref Range   Sodium 129 (L) 135 - 145 mmol/L   Potassium 4.0 3.5 - 5.1 mmol/L   Chloride 96 (L) 98 - 111 mmol/L    CO2 24 22 - 32 mmol/L   Glucose, Bld 128 (H) 70 - 99 mg/dL   BUN 7 (L) 8 - 23 mg/dL   Creatinine, Ser 7.82 0.44 - 1.00 mg/dL   Calcium 8.3 (L) 8.9 - 10.3 mg/dL   GFR, Estimated >95 >62 mL/min   Anion gap 9 5 - 15    ASSESSMENT: Kristen Smith is a 82 y.o. female, 1 Day Post-Op s/p OPEN REDUCTION INTERNAL FIXATION LEFT TIBIAL PLATEAU  CV/Blood loss: Acute blood loss anemia, Hgb 11.7 this morning. Hemodynamically stable  PLAN: Weightbearing: TDWB LLE ROM: Okay for unrestricted ROM Incisional and dressing care: Reinforce dressings as needed  Showering: Okay to begin getting incisions wet 10/13/2022 Orthopedic device(s): None  Pain management:  1. Tylenol 1000 mg q 6 hours PRN 2. Robaxin 500 mg q 6 hours PRN 3. Toradol 15 mg q 6 hours PRN x 5 days VTE prophylaxis: Lovenox, SCDs ID:  Ancef 2gm post op Foley/Lines:  No foley, KVO IVFs Impediments to Fracture Healing: Vitamin D level is 23, will start supplementation today Dispo: Address hyponatremia this AM. PT/OT evaluation today, plan for d/c home today if progressing well. Will reach out to case manager to help arrange  HH PT/OT/aide  D/C recommendations: -Tylenol for pain control -Aspirin 325 mg daily x 30 days for DVT prophylaxis -Continue 1000 units Vit D supplementation daily x 90 days  Follow - up plan: 2 weeks after discharge for wound check and repeat x-rays   Contact information:  Truitt Merle MD, Thyra Breed PA-C. After hours and holidays please check [email or web address] for group call information for [facility]   Thompson Caul, PA-C [phone] (office) [email or web address]

## 2022-10-12 DIAGNOSIS — S82122A Displaced fracture of lateral condyle of left tibia, initial encounter for closed fracture: Secondary | ICD-10-CM | POA: Insufficient documentation

## 2022-10-12 NOTE — Discharge Summary (Signed)
Orthopaedic Trauma Service (OTS) Discharge Summary   Patient ID: Kristen Smith MRN: [id number] DOB/AGE: 1940/11/12 82 y.o.  Admit date: 10/10/2022 Discharge date: 10/11/2022  Admission Diagnoses: Left tibial plateau fracture  Discharge Diagnoses:  Active Problems:   Closed fracture of lateral portion of left tibial plateau   Past Medical History:  Diagnosis Date   Anxiety    Arthritis    COVID 03/2022   Dysrhythmia    "years ago"   )   Hypertension    PONV (postoperative nausea and vomiting) 2003 ??   [facility] Pt also had severe delirium after general anesthesia May of 2023 at [facility]     Procedures Performed: ORIF left tibial plateau  Discharged Condition: [facility] Course: Patient presented to [facility] on 10/10/2022 for scheduled procedure left lower extremity.  Was taken to the operating room by Dr. Jena Gauss for the above procedure.  She tolerated this well without complications.  Was instructed to be touchdown weightbearing left lower extremity postoperatively then allowed unrestricted range of motion of the left knee.  Patient admitted for pain control and therapies.  Began working with physical and Occupational Therapy starting on postoperative day #1.  Was started on Lovenox for DVT prophylaxis starting on postoperative day #1. On 10/12/2022, the patient was tolerating diet, working well with therapies, pain well controlled, vital signs stable, dressings clean, dry, intact and felt stable for discharge to home. Patient will follow up as below and knows to call with questions or concerns.     Consults: None  Significant Diagnostic Studies:   Results for orders placed or performed during the hospital encounter of 10/10/22 (from the past 168 hour(s))  Basic metabolic panel per protocol   Collection Time: 10/10/22  7:27 AM  Result Value Ref Range   Sodium 131 (L) 135 - 145 mmol/L   Potassium 4.2 3.5 - 5.1 mmol/L   Chloride 96 (L) 98 -  111 mmol/L   CO2 24 22 - 32 mmol/L   Glucose, Bld 90 70 - 99 mg/dL   BUN 12 8 - 23 mg/dL   Creatinine, Ser 8.65 0.44 - 1.00 mg/dL   Calcium 9.2 8.9 - 78.4 mg/dL   GFR, Estimated >69 >62 mL/min   Anion gap 11 5 - 15  CBC per protocol   Collection Time: 10/10/22  7:27 AM  Result Value Ref Range   WBC 6.9 4.0 - 10.5 K/uL   RBC 4.55 3.87 - 5.11 MIL/uL   Hemoglobin 14.2 12.0 - 15.0 g/dL   HCT 95.2 84.1 - 32.4 %   MCV 92.7 80.0 - 100.0 fL   MCH 31.2 26.0 - 34.0 pg   MCHC 33.6 30.0 - 36.0 g/dL   RDW 40.1 02.7 - 25.3 %   Platelets 304 150 - 400 K/uL   nRBC 0.0 0.0 - 0.2 %  VITAMIN D 25 Hydroxy (Vit-D Deficiency, Fractures)   Collection Time: 10/10/22  3:53 PM  Result Value Ref Range   Vit D, 25-Hydroxy 23.50 (L) 30 - 100 ng/mL  CBC   Collection Time: 10/11/22  3:18 AM  Result Value Ref Range   WBC 8.3 4.0 - 10.5 K/uL   RBC 3.87 3.87 - 5.11 MIL/uL   Hemoglobin 11.7 (L) 12.0 - 15.0 g/dL   HCT 66.4 (L) 40.3 - 47.4 %   MCV 90.4 80.0 - 100.0 fL   MCH 30.2 26.0 - 34.0 pg   MCHC 33.4 30.0 - 36.0 g/dL   RDW  13.1 11.5 - 15.5 %   Platelets 283 150 - 400 K/uL   nRBC 0.0 0.0 - 0.2 %  Basic metabolic panel   Collection Time: 10/11/22  3:18 AM  Result Value Ref Range   Sodium 129 (L) 135 - 145 mmol/L   Potassium 4.0 3.5 - 5.1 mmol/L   Chloride 96 (L) 98 - 111 mmol/L   CO2 24 22 - 32 mmol/L   Glucose, Bld 128 (H) 70 - 99 mg/dL   BUN 7 (L) 8 - 23 mg/dL   Creatinine, Ser 1.61 0.44 - 1.00 mg/dL   Calcium 8.3 (L) 8.9 - 10.3 mg/dL   GFR, Estimated >09 >60 mL/min   Anion gap 9 5 - 15     Treatments: IV hydration, antibiotics: Ancef, analgesia: acetaminophen, Toradol and oxycodone, anticoagulation: LMW heparin, therapies: PT and OT, and surgery: As above  Discharge Exam: General: Sitting up in bed, NAD Respiratory: No increased work of breathing.  Left lower extremity: Dressing clean, dry, intact.  Tenderness about the knee as expected.  No significant calf tenderness.  Tolerates gentle  knee and ankle ROM.  Able to wiggle toes.  Compartments soft and compressible. + DP pulse  Disposition: Discharge disposition: 06-Home-Health Care Svc       Discharge Instructions     Call MD / Call 911   Complete by: As directed    If you experience chest pain or shortness of breath, CALL 911 and be transported to the hospital emergency room.  If you develope a fever above 101 F, pus (white drainage) or increased drainage or redness at the wound, or calf pain, call your surgeon's office.   Constipation Prevention   Complete by: As directed    Drink plenty of fluids.  Prune juice may be helpful.  You may use a stool softener, such as Colace (over the counter) 100 mg twice a day.  Use MiraLax (over the counter) for constipation as needed.   Diet - low sodium heart healthy   Complete by: As directed    Increase activity slowly as tolerated   Complete by: As directed    Post-operative opioid taper instructions:   Complete by: As directed    POST-OPERATIVE OPIOID TAPER INSTRUCTIONS: It is important to wean off of your opioid medication as soon as possible. If you do not need pain medication after your surgery it is ok to stop day one. Opioids include: Codeine, Hydrocodone(Norco, Vicodin), Oxycodone(Percocet, oxycontin) and hydromorphone amongst others.  Long term and even short term use of opiods can cause: Increased pain response Dependence Constipation Depression Respiratory depression And more.  Withdrawal symptoms can include Flu like symptoms Nausea, vomiting And more Techniques to manage these symptoms Hydrate well Eat regular healthy meals Stay active Use relaxation techniques(deep breathing, meditating, yoga) Do Not substitute Alcohol to help with tapering If you have been on opioids for less than two weeks and do not have pain than it is ok to stop all together.  Plan to wean off of opioids This plan should start within one week post op of your joint  replacement. Maintain the same interval or time between taking each dose and first decrease the dose.  Cut the total daily intake of opioids by one tablet each day Next start to increase the time between doses. The last dose that should be eliminated is the evening dose.         Allergies as of 10/11/2022       Reactions   Codeine  Hives   Percocet [oxycodone-acetaminophen] Other (See Comments)   Confusion        Medication List     TAKE these medications    acetaminophen 500 MG tablet Commonly known as: TYLENOL Take 2 tablets (1,000 mg total) by mouth every 6 (six) hours.   Advil 200 MG tablet Generic drug: ibuprofen Take 600 mg by mouth every 6 (six) hours as needed for mild pain or headache.   ALPRAZolam 0.5 MG tablet Commonly known as: XANAX Take 0.5 mg by mouth 3 (three) times daily as needed for anxiety.   aspirin EC 325 MG tablet Take 1 tablet (325 mg total) by mouth daily.   Benefiber Drink Mix Pack Take 1 Package by mouth daily. Additional of needed at bedtime Take with orange juice   Cal Mag Zinc +D3 Tabs Take 1 tablet by mouth daily. +K   cetirizine 10 MG tablet Commonly known as: ZYRTEC Take 10 mg by mouth in the morning.   cyanocobalamin 1000 MCG tablet Commonly known as: VITAMIN B12 Take 1,000 mcg by mouth in the morning.   donepezil 5 MG tablet Commonly known as: ARICEPT Take 1 tablet (5 mg total) by mouth at bedtime.   escitalopram 10 MG tablet Commonly known as: LEXAPRO Take 10 mg by mouth at bedtime.   JUICE PLUS FIBRE PO Take 2 capsules by mouth in the morning. Vegetable Blend (1) + Fruit Blend (1)   magnesium gluconate 500 MG tablet Commonly known as: MAGONATE Take 500 mg by mouth at bedtime.   metoprolol succinate 50 MG 24 hr tablet Commonly known as: TOPROL-XL Take 50 mg by mouth at bedtime. Take with or immediately following a meal.   rosuvastatin 5 MG tablet Commonly known as: CRESTOR Take 5 mg by mouth at bedtime.    vitamin D3 25 MCG tablet Commonly known as: CHOLECALCIFEROL Take 1 tablet (1,000 Units total) by mouth daily.        Follow-up Information     [facility] Follow up.   Specialty: Home Health Services Why: [facility] will provide home health services.  They will call you to set up services in the next 24-48 hours. Contact information: [address] [place] [address] [phone]                 Discharge Instructions and Plan: Patient will be discharged to home.  Will be followed by home health physical therapy.  Will be discharged on Aspirin for DVT prophylaxis, and tramadol for breakthrough pain control. Patient has been provided with all the necessary DME for discharge. Patient will follow up with Dr. Jena Gauss in 2 weeks for repeat x-rays and wound check.   Signed:  Thompson Caul, PA-C ?([phone]? (phone) 10/12/2022, 2:11 PM  [facility] [address] [place] [address] [phone] (F)

## 2022-10-24 ENCOUNTER — Other Ambulatory Visit: Payer: Self-pay

## 2022-10-24 MED ORDER — DONEPEZIL HCL 5 MG PO TABS: 5.0000 mg | ORAL_TABLET | Freq: Every day | ORAL | 11 refills | Status: DC

## 2022-11-06 ENCOUNTER — Ambulatory Visit: Payer: Medicare HMO | Admitting: Neurology

## 2022-11-06 ENCOUNTER — Encounter: Payer: Self-pay | Admitting: Neurology

## 2022-11-06 VITALS — BP 131/80 | HR 60 | Ht 64.0 in | Wt 130.1 lb

## 2022-11-06 DIAGNOSIS — G3184 Mild cognitive impairment, so stated: Secondary | ICD-10-CM

## 2022-11-06 MED ORDER — DONEPEZIL HCL 5 MG PO TABS: 5.0000 mg | ORAL_TABLET | Freq: Every day | ORAL | 3 refills | Status: DC

## 2022-11-06 NOTE — Patient Instructions (Signed)
Continue current medications including Aricept 5 mg nightly Will obtain ATN profile to look for Alzheimer's biomarker Continue follow-up PCP Return in 1 year or sooner if worse.   There are well-accepted and sensible ways to reduce risk for Alzheimers disease and other degenerative brain disorders .  Exercise Daily Walk A daily 20 minute walk should be part of your routine. Disease related apathy can be a significant roadblock to exercise and the only way to overcome this is to make it a daily routine and perhaps have a reward at the end (something your loved one loves to eat or drink perhaps) or a personal trainer coming to the home can also be very useful. Most importantly, the patient is much more likely to exercise if the caregiver / spouse does it with him/her. In general a structured, repetitive schedule is best.  General Health: Any diseases which effect your body will effect your brain such as a pneumonia, urinary infection, blood clot, heart attack or stroke. Keep contact with your primary care doctor for regular follow ups.  Sleep. A good nights sleep is healthy for the brain. Seven hours is recommended. If you have insomnia or poor sleep habits we can give you some instructions. If you have sleep apnea wear your mask.  Diet: Eating a heart healthy diet is also a good idea; fish and poultry instead of red meat, nuts (mostly non-peanuts), vegetables, fruits, olive oil or canola oil (instead of butter), minimal salt (use other spices to flavor foods), whole grain rice, bread, cereal and pasta and wine in moderation.Research is now showing that the MIND diet, which is a combination of The Mediterranean diet and the DASH diet, is beneficial for cognitive processing and longevity. Information about this diet can be found in The MIND Diet, a book by Alonna Minium, MS, RDN, and online at [employer]  Finances, Power of [address] and Advance Directives: You should  consider putting legal safeguards in place with regard to financial and medical decision making. While the spouse always has power of attorney for medical and financial issues in the absence of any form, you should consider what you want in case the spouse / caregiver is no longer around or capable of making decisions.

## 2022-11-06 NOTE — Progress Notes (Signed)
[facility]  PATIENT: Kristen Smith DOB: Jun 10, 1940  REQUESTING CLINICIAN: Gordan Payment., MD HISTORY FROM: Patient and daughter  REASON FOR VISIT: Memory loss    HISTORICAL  CHIEF COMPLAINT:  Chief Complaint  Patient presents with   Memory Loss    Rm12, daughter present Memory:moca was 85   INTERVAL HISTORY 11/06/2022:  Patient presents today for follow-up, she is accompanied by her daughter.  Since last visit a year ago, daughter reports that she is stable.  She is compliant with Aricept, 5 mgt nightly, denies any side effect from the medication.  Unfortunately a few weeks ago she fell and broke her knee.  She is doing well after surgery.  She did not hit her head.  Daughter feels that her memory is stable, currently does not have any concerns or any major questions.  They also report a tremor more so on the right arm, that is intermittent and feels like it is related to anxiety. She is compliant with the rest of her medications.    HISTORY OF PRESENT ILLNESS:  This is a 81 year old woman past medical history of hypertension, hyperlipidemia, anxiety depression and insomnia who is presenting with memory problem.  History mainly obtained from daughter.  Per daughter patient memory has been declining for the past year, she describes episode of forgetfulness and patient being very repetitive, stating the same information or asking the same questions.  She did have a left hip replacement 6 weeks ago and she cannot remember her hospitalization due to anesthesia and pain meds.  She report during the post operative period time she did have bilateral hand tremor that was still happening 2 to 3 weeks after the surgery but currently she is not experiencing them.  Also during that time she was having hallucinations and changes in her behavior and again could not remember her hospital stay.  Currently she is back to her normal self.  She is doing physical therapy at home.  Her  gait is better and her hip pain is better controlled.  She lives with her husband, she cooks, cleans, she is handling the bills, she still drives and denies any recent accident and denies being lost in familiar places.  She does have a family history of Alzheimer's dementia in her mother.  She is worried that this is the beginning of Alzheimer disease   TBI:   No past history of TBI Stroke:   no past history of stroke Seizures:   no past history of seizures Sleep:   no history of sleep apnea.  Mood: Yes, anxiety and depression.   Functional status: independent in all ADLs and IADLs Patient lives with husband  Cooking: Patient  Cleaning: Patient  Shopping: daughter due to previous left hip pain and covid pandemic  Bathing: Patient Toileting: Patient Driving: Patient  Bills: Patient   Ever left the stove on by accident?: Denies Forget how to use items around the house?: Denies Getting lost going to familiar places?: No  Forgetting loved ones names?: Denies Word finding difficulty? No  Sleep: Better    OTHER MEDICAL CONDITIONS: Anxiety, Depression, CAD, Hyperlipidemia, Hypertension   REVIEW OF SYSTEMS: Full 14 system review of systems performed and negative with exception of: As noted in the HPI   ALLERGIES: Allergies  Allergen Reactions   Codeine Hives   Percocet [Oxycodone-Acetaminophen] Other (See Comments)    Confusion    HOME MEDICATIONS: Outpatient Medications Prior to Visit  Medication Sig Dispense Refill   ALPRAZolam (  XANAX) 0.5 MG tablet Take 0.5 mg by mouth 3 (three) times daily as needed for anxiety.     aspirin EC 325 MG tablet Take 1 tablet (325 mg total) by mouth daily. 30 tablet 0   cetirizine (ZYRTEC) 10 MG tablet Take 10 mg by mouth in the morning.     cholecalciferol (CHOLECALCIFEROL) 25 MCG tablet Take 1 tablet (1,000 Units total) by mouth daily. 30 tablet 2   escitalopram (LEXAPRO) 10 MG tablet Take 10 mg by mouth at bedtime.     ibuprofen (ADVIL)  200 MG tablet Take 600 mg by mouth every 6 (six) hours as needed for mild pain or headache.     magnesium gluconate (MAGONATE) 500 MG tablet Take 500 mg by mouth at bedtime.     metoprolol succinate (TOPROL-XL) 50 MG 24 hr tablet Take 50 mg by mouth at bedtime. Take with or immediately following a meal.     Multiple Minerals-Vitamins (CAL MAG ZINC +D3) TABS Take 1 tablet by mouth daily. +K     Nutritional Supplements (JUICE PLUS FIBRE PO) Take 2 capsules by mouth in the morning. Vegetable Blend (1) + Fruit Blend (1)     rosuvastatin (CRESTOR) 5 MG tablet Take 5 mg by mouth at bedtime.     vitamin B-12 (CYANOCOBALAMIN) 1000 MCG tablet Take 1,000 mcg by mouth in the morning.     Wheat Dextrin (BENEFIBER DRINK MIX) PACK Take 1 Package by mouth daily. Additional of needed at bedtime Take with orange juice     donepezil (ARICEPT) 5 MG tablet Take 1 tablet (5 mg total) by mouth at bedtime. 30 tablet 11   acetaminophen (TYLENOL) 500 MG tablet Take 2 tablets (1,000 mg total) by mouth every 6 (six) hours. 30 tablet 0   No facility-administered medications prior to visit.    PAST MEDICAL HISTORY: Past Medical History:  Diagnosis Date   Anxiety    Arthritis    COVID 03/2022   Dysrhythmia    "years ago"   )   Hypertension    PONV (postoperative nausea and vomiting) 2003 ??   at [facility] Pt also had severe delirium after general anesthesia May of 2023 at [facility]    PAST SURGICAL HISTORY: Past Surgical History:  Procedure Laterality Date   ABDOMINAL HYSTERECTOMY     CARDIAC CATHETERIZATION     when pt  had sxs ..irregular heart beat and chest pain .   ORIF TIBIA PLATEAU Left 10/10/2022   Procedure: OPEN REDUCTION INTERNAL FIXATION (ORIF) TIBIAL PLATEAU;  Surgeon: Roby Lofts, MD;  Location: [facility] OR;  Service: Orthopedics;  Laterality: Left;   PARTIAL MASTECTOMY WITH NEEDLE LOCALIZATION  04/29/2012   Procedure: PARTIAL MASTECTOMY WITH NEEDLE LOCALIZATION;  Surgeon: Maisie Fus A. Cornett, MD;   Location: [facility] OR;  Service: General;  Laterality: Left;  left breast partial mastectomy with needle localization   ROTATOR CUFF REPAIR     TONSILLECTOMY     as a child   TOTAL HIP ARTHROPLASTY Left 09/19/2021   Procedure: TOTAL HIP ARTHROPLASTY ANTERIOR APPROACH;  Surgeon: Durene Romans, MD;  Location: [facility] OR[facility];  Service: Orthopedics;  Laterality: Left;    FAMILY HISTORY: Family History  Problem Relation Age of Onset   Alzheimer's disease Mother     SOCIAL HISTORY: Social History   Socioeconomic History   Marital status: Married    Spouse name: joe   Number of children: 1   Years of education: Not on file   Highest education level: Not  on file  Occupational History   Not on file  Tobacco Use   Smoking status: Never   Smokeless tobacco: Never  Vaping Use   Vaping status: Never Used  Substance and Sexual Activity   Alcohol use: No   Drug use: No   Sexual activity: Yes    Birth control/protection: None  Other Topics Concern   Not on file  Social History Narrative   Not on file   Social Determinants of Health   Financial Resource Strain: Not on file  Food Insecurity: Low Risk  (10/12/2022)   [facility]   Food vital sign    Within the past 12 months, you worried that your food would run out before you got money to buy more: Never true    Within the past 12 months, the food you bought just didn't last and you didn't have money to get more. : Never true  Transportation Needs: No Transportation Needs (10/12/2022)   [facility]   Transportation    In the past 12 months, has lack of reliable transportation kept you from medical appointments, meetings, work or from getting things needed for daily living? : No  Physical Activity: Not on file  Stress: Not on file  Social Connections: Not on file  Intimate Partner Violence: Not on file    PHYSICAL EXAM  GENERAL EXAM/CONSTITUTIONAL: Vitals:  Vitals:   11/06/22  1431  BP: 131/80  Pulse: 60  Weight: 130 lb 1.1 oz (59 kg)  Height: 5\' 4"  (1.626 m)   Body mass index is 22.33 kg/m. Wt Readings from Last 3 Encounters:  11/06/22 130 lb 1.1 oz (59 kg)  10/10/22 130 lb (59 kg)  11/02/21 122 lb (55.3 kg)   Patient is in no distress; well developed, nourished and groomed; neck is supple  MUSCULOSKELETAL: Gait, strength, tone, movements noted in Neurologic exam below  NEUROLOGIC: MENTAL STATUS:      No data to display            11/06/2022    2:32 PM 11/02/2021    2:13 PM  Montreal Cognitive Assessment   Visuospatial/ Executive (0/5) 3 3  Naming (0/3) 2 3  Attention: Read list of digits (0/2) 1 2  Attention: Read list of letters (0/1) 1 1  Attention: Serial 7 subtraction starting at 100 (0/3) 3 1  Language: Repeat phrase (0/2) 0 1  Language : Fluency (0/1) 1 1  Abstraction (0/2) 2 1  Delayed Recall (0/5) 0 0  Orientation (0/6) 5 5  Total 18 18     CRANIAL NERVE:  2nd, 3rd, 4th, 6th - visual fields full to confrontation, extraocular muscles intact, no nystagmus 5th - facial sensation symmetric 7th - facial strength symmetric 8th - hearing intact 9th - palate elevates symmetrically, uvula midline 11th - shoulder shrug symmetric 12th - tongue protrusion midline  MOTOR:  normal bulk and tone, full strength in the BUE, BLE  SENSORY:  normal and symmetric to light touch, vibration  COORDINATION:  finger-nose-finger, fine finger movements normal  REFLEXES:  deep tendon reflexes present and symmetric  GAIT/STATION:  Not tested, on a wheelchair     DIAGNOSTIC DATA (LABS, IMAGING, TESTING) - I reviewed patient records, labs, notes, testing and imaging myself where available.  Lab Results  Component Value Date   WBC 8.3 10/11/2022   HGB 11.7 (L) 10/11/2022   HCT 35.0 (L) 10/11/2022   MCV 90.4 10/11/2022   PLT 283  10/11/2022      Component Value Date/Time   NA 129 (L) 10/11/2022 0318   K 4.0 10/11/2022 0318   CL 96  (L) 10/11/2022 0318   CO2 24 10/11/2022 0318   GLUCOSE 128 (H) 10/11/2022 0318   BUN 7 (L) 10/11/2022 0318   CREATININE 0.73 10/11/2022 0318   CALCIUM 8.3 (L) 10/11/2022 0318   PROT 6.9 04/21/2012 1345   ALBUMIN 3.9 04/21/2012 1345   AST 26 04/21/2012 1345   ALT 21 04/21/2012 1345   ALKPHOS 96 04/21/2012 1345   BILITOT 0.3 04/21/2012 1345   GFRNONAA >60 10/11/2022 0318   GFRAA >90 04/21/2012 1345   No results found for: "CHOL", "HDL", "LDLCALC", "LDLDIRECT", "TRIG", "CHOLHDL" No results found for: "HGBA1C" No results found for: "VITAMINB12" No results found for: "TSH"   MRI 01/18/2020 1. No acute intracranial abnormality. 2. Moderate chronic microvascular ischemic disease and mild diffuse cerebral volume loss without lobar predilection   ASSESSMENT AND PLAN  82 y.o. year old female with hypertension, hyperlipidemia, anxiety depression and insomnia who is presenting for follow up for her mild cognitive impairment.  She is compliant with Aricept 5 mg nightly, denies any side effect from the medication.  She is compliant with her other medications.  Plan for now is to obtain ATN profile to look for Alzheimer disease biomarker.  If present, this will confirm that patient cognitive impairment is likely due to Alzheimer disease.  For now since she is stable, we will continue her on Aricept 5 mg, no need to increase the dose.  They understand to contact me if her condition is getting worse.  Continue to follow with PCP and return in 1 year or sooner if worse.   1. Mild cognitive impairment      Patient Instructions  Continue current medications including Aricept 5 mg nightly Will obtain ATN profile to look for Alzheimer's biomarker Continue follow-up PCP Return in 1 year or sooner if worse.   There are well-accepted and sensible ways to reduce risk for Alzheimers disease and other degenerative brain disorders .  Exercise Daily Walk A daily 20 minute walk should be part of your  routine. Disease related apathy can be a significant roadblock to exercise and the only way to overcome this is to make it a daily routine and perhaps have a reward at the end (something your loved one loves to eat or drink perhaps) or a personal trainer coming to the home can also be very useful. Most importantly, the patient is much more likely to exercise if the caregiver / spouse does it with him/her. In general a structured, repetitive schedule is best.  General Health: Any diseases which effect your body will effect your brain such as a pneumonia, urinary infection, blood clot, heart attack or stroke. Keep contact with your primary care doctor for regular follow ups.  Sleep. A good nights sleep is healthy for the brain. Seven hours is recommended. If you have insomnia or poor sleep habits we can give you some instructions. If you have sleep apnea wear your mask.  Diet: Eating a heart healthy diet is also a good idea; fish and poultry instead of red meat, nuts (mostly non-peanuts), vegetables, fruits, olive oil or canola oil (instead of butter), minimal salt (use other spices to flavor foods), whole grain rice, bread, cereal and pasta and wine in moderation.Research is now showing that the MIND diet, which is a combination of The Mediterranean diet and the DASH diet, is beneficial for  cognitive processing and longevity. Information about this diet can be found in The MIND Diet, a book by Alonna Minium, MS, RDN, and online at WildWildScience.es  Finances, Power of [address] and Advance Directives: You should consider putting legal safeguards in place with regard to financial and medical decision making. While the spouse always has power of attorney for medical and financial issues in the absence of any form, you should consider what you want in case the spouse / caregiver is no longer around or capable of making decisions.    Orders Placed This Encounter  Procedures   ATN  PROFILE   Vitamin B12    Meds ordered this encounter  Medications   donepezil (ARICEPT) 5 MG tablet    Sig: Take 1 tablet (5 mg total) by mouth at bedtime.    Dispense:  90 tablet    Refill:  3    Return in about 1 year (around 11/06/2023).   Windell Norfolk, MD 11/06/2022, 5:02 PM  [facility] [address], [address] [place], [place] [address] [phone]

## 2022-11-09 LAB — ATN PROFILE
A -- Beta-amyloid 42/40 Ratio: 0.107 (ref >0.102)
Beta-amyloid 40: 202.44 pg/mL
Beta-amyloid 42: 21.72 pg/mL
N -- NfL, Plasma: 5.82 pg/mL (ref 0.00–7.64)
T -- p-tau181: 1.24 pg/mL — ABNORMAL HIGH (ref 0.00–0.97)

## 2022-11-09 LAB — VITAMIN B12: Vitamin B-12: 1402 pg/mL — ABNORMAL HIGH (ref 232–1245)

## 2023-01-02 ENCOUNTER — Other Ambulatory Visit: Payer: Self-pay

## 2023-01-02 DIAGNOSIS — I499 Cardiac arrhythmia, unspecified: Secondary | ICD-10-CM | POA: Diagnosis not present

## 2023-01-02 DIAGNOSIS — R001 Bradycardia, unspecified: Secondary | ICD-10-CM | POA: Diagnosis not present

## 2023-01-02 DIAGNOSIS — R42 Dizziness and giddiness: Secondary | ICD-10-CM | POA: Diagnosis not present

## 2023-01-02 DIAGNOSIS — I493 Ventricular premature depolarization: Secondary | ICD-10-CM

## 2023-01-02 DIAGNOSIS — I498 Other specified cardiac arrhythmias: Secondary | ICD-10-CM | POA: Diagnosis not present

## 2023-01-03 ENCOUNTER — Ambulatory Visit: Payer: Medicare HMO | Attending: Cardiology

## 2023-01-03 DIAGNOSIS — I493 Ventricular premature depolarization: Secondary | ICD-10-CM

## 2023-01-03 DIAGNOSIS — R42 Dizziness and giddiness: Secondary | ICD-10-CM | POA: Diagnosis not present

## 2023-02-14 ENCOUNTER — Encounter: Payer: Self-pay | Admitting: *Deleted

## 2023-02-14 NOTE — Progress Notes (Signed)
Sent a note to her about event monitor results on MyChart. This was requested after initial consult by [facility] at [facility] in September.   Frequent PVC burden, runs of NSVT up to 12 beat duration. Rare PAC burden, runs up to 15 seconds during sleeping hours. Asymptomatic. Was discharged on metoprolol XL 50 mg once daily from [facility] in September.  Will need follow-up visit

## 2023-02-14 NOTE — Progress Notes (Signed)
Cardiology Office Note:  .   Date:  02/15/2023  ID:  JNAI FAUVER, DOB 1941-04-17, MRN [id number] PCP: Krystal Clark, NP  [facility] Providers Cardiologist:  None    History of Present Illness: .   Kristen Smith is a 82 y.o. female with a past medical history of hypertension, dyslipidemia, GERD, PVCs.  01/03/2023 monitor average heart rate 75 bpm, predominant rhythm was sinus, 11 episodes of VT occurred the fastest lasting 11 beats, 19 episodes of SVT, some episodes of SVT T may possibly be atrial tachycardia, VE's were frequent at 12.2% 01/02/2023 echo impaired relaxation, EF 55 to 60%, trivial to mild MR, aortic valve mildly sclerotic without stenosis 01/18/2020 carotid ultrasound no significant stenosis  She established care with Dr. Allyson Sabal on 09/05/2021 for preoperative evaluation for upcoming right hip.  She had previously had a left heart cath 10 years prior that was normal.  She was admitted to [facility] on 01/01/2023 after her heart rate was noted to be 29 bpm, she also had some accompanying dizziness.  EKG revealed normal sinus rhythm, no arrhythmias, troponins were negative, proBNP 887, TSH was normal.  She was evaluated by cardiology, it was felt that her low heart rate was secondary to increased PVC burden.  Monitor was arranged which revealed an average heart rate of 75 bpm, predominant rhythm was sinus, 11 episodes of VT, 19 episodes of SVT, VE's were frequent at 12.2%.  She presents today for follow-up after recent testing as outlined above.  She is feeling well, offers no complaints, her episodes of low heart rate were found during a PT session however she has not had any issues. She denies chest pain, palpitations, dyspnea, pnd, orthopnea, n, v, dizziness, syncope, edema, weight gain, or early satiety.    ROS: Review of Systems  All other systems reviewed and are negative.    Studies Reviewed: .         Risk Assessment/Calculations:              Physical Exam:   VS:  BP 137/89 (BP Location: Right Arm, Patient Position: Sitting, Cuff Size: Normal)   Pulse 70   Ht 5\' 4"  (1.626 m)   Wt 143 lb (64.9 kg)   SpO2 97%   BMI 24.55 kg/m    Wt Readings from Last 3 Encounters:  02/15/23 143 lb (64.9 kg)  11/06/22 130 lb 1.1 oz (59 kg)  10/10/22 130 lb (59 kg)    GEN: Appears younger than stated age, well nourished, well developed in no acute distress NECK: No JVD; No carotid bruits CARDIAC: RRR, no murmurs, rubs, gallops RESPIRATORY:  Clear to auscultation without rales, wheezing or rhonchi  ABDOMEN: Soft, non-tender, non-distended EXTREMITIES:  No edema; No deformity   ASSESSMENT AND PLAN: .   SVT/VT/PVCs - Monitor revealed 11 episodes of VT occurred the fastest lasting 11 beats, 19 episodes of SVT, some episodes of SVT T may possibly be atrial tachycardia, VE's were frequent at 12.2%. Will increase metoprolol to 75 mg daily. Will arrange for an ischemic evaluation. Recent echo impaired relaxation, EF 55 to 60%, trivial to mild MR, aortic valve mildly sclerotic without stenosis.  HTN - BP is marginally elevated today at 137/89, currently not on any antihypertensive agent.       Informed Consent   Shared Decision Making/Informed Consent The risks [chest pain, shortness of breath, cardiac arrhythmias, dizziness, blood pressure fluctuations, myocardial infarction, stroke/transient ischemic attack, nausea, vomiting, allergic reaction, radiation exposure, metallic  taste sensation and life-threatening complications (estimated to be 1 in 10,000)], benefits (risk stratification, diagnosing coronary artery disease, treatment guidance) and alternatives of a nuclear stress test were discussed in detail with Kristen Smith and she agrees to proceed.     Dispo: Increase Metoprolol to 75 mg daily. Lexiscan, follow up depending on the results of testing.   Signed, Flossie Dibble, NP

## 2023-02-15 ENCOUNTER — Ambulatory Visit: Payer: Medicare HMO | Attending: Cardiology | Admitting: Cardiology

## 2023-02-15 ENCOUNTER — Encounter: Payer: Self-pay | Admitting: Cardiology

## 2023-02-15 VITALS — BP 137/89 | HR 70 | Ht 64.0 in | Wt 143.0 lb

## 2023-02-15 DIAGNOSIS — I472 Ventricular tachycardia, unspecified: Secondary | ICD-10-CM | POA: Diagnosis not present

## 2023-02-15 DIAGNOSIS — I10 Essential (primary) hypertension: Secondary | ICD-10-CM | POA: Diagnosis not present

## 2023-02-15 DIAGNOSIS — I471 Supraventricular tachycardia, unspecified: Secondary | ICD-10-CM

## 2023-02-15 DIAGNOSIS — I493 Ventricular premature depolarization: Secondary | ICD-10-CM

## 2023-02-15 DIAGNOSIS — R42 Dizziness and giddiness: Secondary | ICD-10-CM

## 2023-02-15 NOTE — Patient Instructions (Signed)
Medication Instructions:  Your physician has recommended you make the following change in your medication:  Increase Metoprolol to 75 mg daily, 1 and 1/2 tabs of the 50  *If you need a refill on your cardiac medications before your next appointment, please call your pharmacy*   Lab Work: NONE If you have labs (blood work) drawn today and your tests are completely normal, you will receive your results only by: MyChart Message (if you have MyChart) OR A paper copy in the mail If you have any lab test that is abnormal or we need to change your treatment, we will call you to review the results.   Testing/Procedures: Your physician has requested that you have a lexiscan myoview. For further information please visit [email or web address] Please follow instruction sheet, as given.   The test will take approximately 3 to 4 hours to complete; you may bring reading material.  If someone comes with you to your appointment, they will need to remain in the main lobby due to limited space in the testing area. How to prepare for your Myocardial Perfusion Test:             Do not eat or drink 3 hours prior to your test, except you may have water. Do not consume products containing caffeine (regular or decaffeinated) 12 hours prior to your test. (ex: coffee, chocolate, sodas, tea). Do bring a list of your current medications with you.  If not listed below, you may take your medications as normal. Do wear comfortable clothes (no dresses or overalls) and walking shoes, tennis shoes preferred (No heels or open toe shoes are allowed). Do NOT wear cologne, perfume, aftershave, or lotions (deodorant is allowed). If these instructions are not followed, your test will have to be rescheduled.    Follow-Up: At [facility], you and your health needs are our priority.  As part of our continuing mission to provide you with exceptional heart care, we have created designated Provider Care Teams.  These Care  Teams include your primary Cardiologist (physician) and Advanced Practice Providers (APPs -  Physician Assistants and Nurse Practitioners) who all work together to provide you with the care you need, when you need it.  We recommend signing up for the patient portal called "MyChart".  Sign up information is provided on this After Visit Summary.  MyChart is used to connect with patients for Virtual Visits (Telemedicine).  Patients are able to view lab/test results, encounter notes, upcoming appointments, etc.  Non-urgent messages can be sent to your provider as well.   To learn more about what you can do with MyChart, go to [email or web address].    Your next appointment:  Depends on test results    Provider:    Other Instructions

## 2023-02-19 ENCOUNTER — Ambulatory Visit: Payer: Medicare HMO | Attending: Cardiology

## 2023-02-19 DIAGNOSIS — I10 Essential (primary) hypertension: Secondary | ICD-10-CM | POA: Diagnosis not present

## 2023-02-19 DIAGNOSIS — I472 Ventricular tachycardia, unspecified: Secondary | ICD-10-CM

## 2023-02-19 DIAGNOSIS — I471 Supraventricular tachycardia, unspecified: Secondary | ICD-10-CM | POA: Diagnosis not present

## 2023-02-19 DIAGNOSIS — I493 Ventricular premature depolarization: Secondary | ICD-10-CM

## 2023-02-19 LAB — MYOCARDIAL PERFUSION IMAGING
LV dias vol: 72 mL (ref 46–106)
LV sys vol: 30 mL
Nuc Stress EF: 58 %
Peak HR: 82 {beats}/min
Rest HR: 57 {beats}/min
Rest Nuclear Isotope Dose: 8.8 mCi
SDS: 1
SRS: 0
SSS: 1
Stress Nuclear Isotope Dose: 24.8 mCi
TID: 1.11

## 2023-02-19 MED ORDER — TECHNETIUM TC 99M TETROFOSMIN IV KIT
8.8000 | PACK | Freq: Once | INTRAVENOUS | Status: AC | PRN
  Administered 2023-02-19: 8.8 via INTRAVENOUS

## 2023-02-19 MED ORDER — REGADENOSON 0.4 MG/5ML IV SOLN
0.4000 mg | Freq: Once | INTRAVENOUS | Status: AC
Start: 2023-02-19 — End: 2023-02-19
  Administered 2023-02-19: 0.4 mg via INTRAVENOUS

## 2023-02-19 MED ORDER — TECHNETIUM TC 99M TETROFOSMIN IV KIT
24.8000 | PACK | Freq: Once | INTRAVENOUS | Status: AC | PRN
  Administered 2023-02-19: 24.8 via INTRAVENOUS

## 2023-02-21 ENCOUNTER — Telehealth: Payer: Self-pay

## 2023-02-21 NOTE — Telephone Encounter (Signed)
Pt viewed Monitor results on My Chart per Dr. Vanetta Shawl note. Routed to PCP.

## 2023-03-29 ENCOUNTER — Other Ambulatory Visit: Payer: Self-pay | Admitting: Emergency Medicine

## 2023-03-29 MED ORDER — METOPROLOL SUCCINATE ER 50 MG PO TB24: 75.0000 mg | ORAL_TABLET | Freq: Every day | ORAL | 1 refills | Status: AC

## 2023-10-28 ENCOUNTER — Other Ambulatory Visit: Payer: Self-pay

## 2023-10-28 MED ORDER — DONEPEZIL HCL 5 MG PO TABS: 5.0000 mg | ORAL_TABLET | Freq: Every day | ORAL | 3 refills | Status: DC

## 2023-11-12 ENCOUNTER — Encounter: Payer: Self-pay | Admitting: Neurology

## 2023-11-12 ENCOUNTER — Ambulatory Visit: Payer: Medicare HMO | Admitting: Neurology

## 2023-11-12 VITALS — BP 124/76 | Ht 64.0 in | Wt 141.0 lb

## 2023-11-12 DIAGNOSIS — G3184 Mild cognitive impairment, so stated: Secondary | ICD-10-CM

## 2023-11-12 NOTE — Patient Instructions (Addendum)
 Increase Aricept  to 10 mg nightly  Continue your other medications  Continue to follow up with PCP  Return in a year or sooner if worse    There are well-accepted and sensible ways to reduce risk for Alzheimers disease and other degenerative brain disorders .  Exercise Daily Walk A daily 20 minute walk should be part of your routine. Disease related apathy can be a significant roadblock to exercise and the only way to overcome this is to make it a daily routine and perhaps have a reward at the end (something your loved one loves to eat or drink perhaps) or a personal trainer coming to the home can also be very useful. Most importantly, the patient is much more likely to exercise if the caregiver / spouse does it with him/her. In general a structured, repetitive schedule is best.  General Health: Any diseases which effect your body will effect your brain such as a pneumonia, urinary infection, blood clot, heart attack or stroke. Keep contact with your primary care doctor for regular follow ups.  Sleep. A good nights sleep is healthy for the brain. Seven hours is recommended. If you have insomnia or poor sleep habits we can give you some instructions. If you have sleep apnea wear your mask.  Diet: Eating a heart healthy diet is also a good idea; fish and poultry instead of red meat, nuts (mostly non-peanuts), vegetables, fruits, olive oil or canola oil (instead of butter), minimal salt (use other spices to flavor foods), whole grain rice, bread, cereal and pasta and wine in moderation.Research is now showing that the MIND diet, which is a combination of The Mediterranean diet and the DASH diet, is beneficial for cognitive processing and longevity. Information about this diet can be found in The MIND Diet, a book by Annitta Feeling, MS, RDN, and online at WildWildScience.es  Finances, Power of [address] and Advance Directives: You should consider putting legal safeguards in place  with regard to financial and medical decision making. While the spouse always has power of attorney for medical and financial issues in the absence of any form, you should consider what you want in case the spouse / caregiver is no longer around or capable of making decisions.

## 2023-11-12 NOTE — Progress Notes (Signed)
 [facility]  PATIENT: Kristen Smith DOB: 05-Jul-1940  REQUESTING CLINICIAN: Benson Eleanor PARAS* HISTORY FROM: Patient and daughter  REASON FOR VISIT: Memory loss    HISTORICAL  CHIEF COMPLAINT:  Chief Complaint  Patient presents with   Follow-up    INTERVAL HISTORY 11/12/2023 Patient presents today for follow-up, she is accompanied by her daughter.  Last visit was a year ago since then she has been doing well.  Daughter tells me that she is still independent all actives of daily living, she goes out to eat with her husband daily.  Memory is stable, sometimes she is repetitive, more forgetful but again she is still independent in all ADLs.  She recuperated very well when it comes to the leg fracture, no additional falls. Her ATN profile was negative for Alzheimer disease pathology.     INTERVAL HISTORY 11/06/2022:  Patient presents today for follow-up, she is accompanied by her daughter.  Since last visit a year ago, daughter reports that she is stable.  She is compliant with Aricept , 5 mgt nightly, denies any side effect from the medication.  Unfortunately a few weeks ago she fell and broke her knee.  She is doing well after surgery.  She did not hit her head.  Daughter feels that her memory is stable, currently does not have any concerns or any major questions.  They also report a tremor more so on the right arm, that is intermittent and feels like it is related to anxiety. She is compliant with the rest of her medications.    HISTORY OF PRESENT ILLNESS:  This is a 83 year old woman past medical history of hypertension, hyperlipidemia, anxiety depression and insomnia who is presenting with memory problem.  History mainly obtained from daughter.  Per daughter patient memory has been declining for the past year, she describes episode of forgetfulness and patient being very repetitive, stating the same information or asking the same questions.  She did have a left  hip replacement 6 weeks ago and she cannot remember her hospitalization due to anesthesia and pain meds.  She report during the post operative period time she did have bilateral hand tremor that was still happening 2 to 3 weeks after the surgery but currently she is not experiencing them.  Also during that time she was having hallucinations and changes in her behavior and again could not remember her hospital stay.  Currently she is back to her normal self.  She is doing physical therapy at home.  Her gait is better and her hip pain is better controlled.  She lives with her husband, she cooks, cleans, she is handling the bills, she still drives and denies any recent accident and denies being lost in familiar places.  She does have a family history of Alzheimer's dementia in her mother.  She is worried that this is the beginning of Alzheimer disease   TBI:   No past history of TBI Stroke:   no past history of stroke Seizures:   no past history of seizures Sleep:   no history of sleep apnea.  Mood: Yes, anxiety and depression.   Functional status: independent in all ADLs and IADLs Patient lives with husband  Cooking: Patient  Cleaning: Patient  Shopping: daughter due to previous left hip pain and covid pandemic  Bathing: Patient Toileting: Patient Driving: Patient  Bills: Patient   Ever left the stove on by accident?: Denies Forget how to use items around the house?: Denies Getting lost going to familiar  places?: No  Forgetting loved ones names?: Denies Word finding difficulty? No  Sleep: Better    OTHER MEDICAL CONDITIONS: Anxiety, Depression, CAD, Hyperlipidemia, Hypertension   REVIEW OF SYSTEMS: Full 14 system review of systems performed and negative with exception of: As noted in the HPI   ALLERGIES: Allergies  Allergen Reactions   Codeine Hives   Percocet [Oxycodone -Acetaminophen ] Other (See Comments)    Confusion    HOME MEDICATIONS: Outpatient Medications Prior to  Visit  Medication Sig Dispense Refill   ALPRAZolam  (XANAX ) 0.5 MG tablet Take 0.5 mg by mouth 3 (three) times daily as needed for anxiety.     cetirizine (ZYRTEC) 10 MG tablet Take 10 mg by mouth in the morning.     D3 HIGH POTENCY 25 MCG (1000 UT) capsule Take 1,000 Units by mouth daily.     donepezil  (ARICEPT ) 5 MG tablet Take 1 tablet (5 mg total) by mouth at bedtime. 90 tablet 3   escitalopram  (LEXAPRO ) 10 MG tablet Take 10 mg by mouth at bedtime.     ibuprofen (ADVIL) 200 MG tablet Take 600 mg by mouth every 6 (six) hours as needed for mild pain or headache.     magnesium  gluconate (MAGONATE) 500 MG tablet Take 500 mg by mouth at bedtime.     metoprolol  succinate (TOPROL -XL) 50 MG 24 hr tablet Take 1.5 tablets (75 mg total) by mouth daily. Take 1 and 1/2 tabs nightly following meal 135 tablet 1   Multiple Minerals-Vitamins (CAL MAG ZINC +D3) TABS Take 1 tablet by mouth daily. +K     Nutritional Supplements (JUICE PLUS FIBRE PO) Take 2 capsules by mouth in the morning. Vegetable Blend (1) + Fruit Blend (1)     vitamin B-12 (CYANOCOBALAMIN ) 1000 MCG tablet Take 1,000 mcg by mouth in the morning.     Wheat Dextrin (BENEFIBER DRINK MIX) PACK Take 1 Package by mouth daily. Additional of needed at bedtime Take with orange juice     rosuvastatin  (CRESTOR ) 5 MG tablet Take 5 mg by mouth at bedtime.     No facility-administered medications prior to visit.    PAST MEDICAL HISTORY: Past Medical History:  Diagnosis Date   Anxiety    Arthritis    COVID 03/2022   Dysrhythmia    years ago   )   Hypertension    PONV (postoperative nausea and vomiting) 2003 ??   [facility] Pt also had severe delirium after general anesthesia May of 2023 at [facility]    PAST SURGICAL HISTORY: Past Surgical History:  Procedure Laterality Date   ABDOMINAL HYSTERECTOMY     CARDIAC CATHETERIZATION     when pt  had sxs ..irregular heart beat and chest pain .   ORIF TIBIA PLATEAU Left 10/10/2022   Procedure:  OPEN REDUCTION INTERNAL FIXATION (ORIF) TIBIAL PLATEAU;  Surgeon: Kendal Franky SQUIBB, MD;  Location: [facility] OR;  Service: Orthopedics;  Laterality: Left;   PARTIAL MASTECTOMY WITH NEEDLE LOCALIZATION  04/29/2012   Procedure: PARTIAL MASTECTOMY WITH NEEDLE LOCALIZATION;  Surgeon: Debby A. Cornett, MD;  Location: [facility] OR;  Service: General;  Laterality: Left;  left breast partial mastectomy with needle localization   ROTATOR CUFF REPAIR     TONSILLECTOMY     as a child   TOTAL HIP ARTHROPLASTY Left 09/19/2021   Procedure: TOTAL HIP ARTHROPLASTY ANTERIOR APPROACH;  Surgeon: Ernie Cough, MD;  Location: [facility];  Service: Orthopedics;  Laterality: Left;    FAMILY HISTORY: Family History  Problem Relation Age  of Onset   Alzheimer's disease Mother     SOCIAL HISTORY: Social History   Socioeconomic History   Marital status: Married    Spouse name: joe   Number of children: 1   Years of education: Not on file   Highest education level: Not on file  Occupational History   Not on file  Tobacco Use   Smoking status: Never   Smokeless tobacco: Never  Vaping Use   Vaping status: Never Used  Substance and Sexual Activity   Alcohol use: No   Drug use: No   Sexual activity: Yes    Birth control/protection: None  Other Topics Concern   Not on file  Social History Narrative   Right handed   Caffeine- occasionally   Married, stays active   Social Drivers of Health   Financial Resource Strain: Not on file  Food Insecurity: Low Risk  (08/28/2023)   Received from [facility]   Hunger Vital Sign    Within the past 12 months, you worried that your food would run out before you got money to buy more: Never true    Within the past 12 months, the food you bought just didn't last and you didn't have money to get more. : Never true  Transportation Needs: No Transportation Needs (08/28/2023)   Received from [facility]    In the past 12 months, has lack of reliable transportation kept  you from medical appointments, meetings, work or from getting things needed for daily living? : No  Physical Activity: Not on file  Stress: Not on file  Social Connections: Not on file  Intimate Partner Violence: Not on file    PHYSICAL EXAM  GENERAL EXAM/CONSTITUTIONAL: Vitals:  Vitals:   11/12/23 1342  BP: 124/76  Weight: 141 lb (64 kg)  Height: 5' 4 (1.626 m)   Body mass index is 24.2 kg/m. Wt Readings from Last 3 Encounters:  11/12/23 141 lb (64 kg)  02/19/23 143 lb (64.9 kg)  02/15/23 143 lb (64.9 kg)   Patient is in no distress; well developed, nourished and groomed; neck is supple  MUSCULOSKELETAL: Gait, strength, tone, movements noted in Neurologic exam below  NEUROLOGIC: MENTAL STATUS:      No data to display            11/12/2023    1:56 PM 11/06/2022    2:32 PM 11/02/2021    2:13 PM  Montreal Cognitive Assessment   Visuospatial/ Executive (0/5) 3 3 3   Naming (0/3) 2 2 3   Attention: Read list of digits (0/2) 2 1 2   Attention: Read list of letters (0/1) 1 1 1   Attention: Serial 7 subtraction starting at 100 (0/3) 1 3 1   Language: Repeat phrase (0/2) 2 0 1  Language : Fluency (0/1) 1 1 1   Abstraction (0/2) 2 2 1   Delayed Recall (0/5) 0 0 0  Orientation (0/6) 3 5 5   Total 17 18 18      CRANIAL NERVE:  2nd, 3rd, 4th, 6th - visual fields full to confrontation, extraocular muscles intact, no nystagmus 5th - facial sensation symmetric 7th - facial strength symmetric 8th - hearing intact 9th - palate elevates symmetrically, uvula midline 11th - shoulder shrug symmetric 12th - tongue protrusion midline  MOTOR:  normal bulk and tone, full strength in the BUE, BLE  SENSORY:  normal and symmetric to light touch, vibration  COORDINATION:  finger-nose-finger, fine finger movements normal  GAIT/STATION:  Normal    DIAGNOSTIC  DATA (LABS, IMAGING, TESTING) - I reviewed patient records, labs, notes, testing and imaging myself where  available.  Lab Results  Component Value Date   WBC 8.3 10/11/2022   HGB 11.7 (L) 10/11/2022   HCT 35.0 (L) 10/11/2022   MCV 90.4 10/11/2022   PLT 283 10/11/2022      Component Value Date/Time   NA 129 (L) 10/11/2022 0318   K 4.0 10/11/2022 0318   CL 96 (L) 10/11/2022 0318   CO2 24 10/11/2022 0318   GLUCOSE 128 (H) 10/11/2022 0318   BUN 7 (L) 10/11/2022 0318   CREATININE 0.73 10/11/2022 0318   CALCIUM  8.3 (L) 10/11/2022 0318   PROT 6.9 04/21/2012 1345   ALBUMIN 3.9 04/21/2012 1345   AST 26 04/21/2012 1345   ALT 21 04/21/2012 1345   ALKPHOS 96 04/21/2012 1345   BILITOT 0.3 04/21/2012 1345   GFRNONAA >60 10/11/2022 0318   GFRAA >90 04/21/2012 1345   No results found for: CHOL, HDL, LDLCALC, LDLDIRECT, TRIG, CHOLHDL No results found for: YHAJ8R Lab Results  Component Value Date   VITAMINB12 1,402 (H) 11/06/2022   No results found for: TSH  ATN profile not consistent with Alzheimer disease pathology.   MRI 01/18/2020 1. No acute intracranial abnormality. 2. Moderate chronic microvascular ischemic disease and mild diffuse cerebral volume loss without lobar predilection   ASSESSMENT AND PLAN  83 y.o. year old female with hypertension, hyperlipidemia, anxiety depression and insomnia who is presenting for follow up for her mild cognitive impairment.  She is compliant with Aricept  5 mg nightly, denies any side effect from the medication. We will increase it to 10 mg nightly.  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  For now continue current medications, continue to follow-up with PCP and return in a year or sooner if worse.   1. Mild cognitive impairment      Patient Instructions  Increase Aricept  to 10 mg nightly  Continue your other medications  Continue to follow up with PCP  Return in a year or sooner if worse     There are well-accepted and sensible ways to reduce risk for Alzheimers disease and other degenerative brain disorders .  Exercise Daily Walk A daily 20 minute walk should be part of your routine. Disease related apathy can be a significant roadblock to exercise and the only way to overcome this is to make it a daily routine and perhaps have a reward at the end (something your loved one loves to eat or drink perhaps) or a personal trainer coming to the home can also be very useful. Most importantly, the patient is much more likely to exercise if the caregiver / spouse does it with him/her. In general a structured, repetitive schedule is best.  General Health: Any diseases which effect your body will effect your brain such as a pneumonia, urinary infection, blood clot, heart attack or stroke. Keep contact with your primary care doctor for regular follow ups.  Sleep. A good nights sleep is healthy for the brain. Seven hours is recommended. If you have insomnia or poor sleep habits we can give you some instructions. If you have sleep apnea wear your mask.  Diet: Eating a heart healthy diet is also a good idea; fish and poultry instead of red meat, nuts (mostly non-peanuts), vegetables, fruits, olive oil or canola oil (instead of butter), minimal salt (use other spices to flavor  foods), whole grain rice, bread, cereal and pasta and wine in moderation.Research is now showing that the MIND diet, which is a combination of The Mediterranean diet and the DASH diet, is beneficial for cognitive processing and longevity. Information about this diet can be found in The MIND Diet, a book by Annitta Feeling, MS, RDN, and online at [employer]  Finances, Power of [address] and Advance Directives: You should consider putting legal safeguards in place with regard to financial and medical decision making. While the spouse always has power of attorney for medical and financial issues in the  absence of any form, you should consider what you want in case the spouse / caregiver is no longer around or capable of making decisions.     No orders of the defined types were placed in this encounter.   No orders of the defined types were placed in this encounter.   Return in about 1 year (around 11/11/2024).  I have spent a total of 45 minutes dedicated to this patient today, preparing to see patient, performing a medically appropriate examination and evaluation, ordering tests and/or medications and procedures, and counseling and educating the patient/family/caregiver; independently interpreting result and communicating results to the family/patient/caregiver; and documenting clinical information in the electronic medical record.    Pastor Falling, MD 11/12/2023, 4:33 PM  [facility] [address] [place], [place] [address] [phone]

## 2023-12-06 ENCOUNTER — Ambulatory Visit (HOSPITAL_BASED_OUTPATIENT_CLINIC_OR_DEPARTMENT_OTHER): Payer: Self-pay | Admitting: Family Medicine

## 2023-12-06 ENCOUNTER — Ambulatory Visit (HOSPITAL_BASED_OUTPATIENT_CLINIC_OR_DEPARTMENT_OTHER)
Admission: RE | Admit: 2023-12-06 | Discharge: 2023-12-06 | Disposition: A | Discharge status: Home / Self Care | Attending: Family Medicine

## 2023-12-06 ENCOUNTER — Ambulatory Visit (HOSPITAL_BASED_OUTPATIENT_CLINIC_OR_DEPARTMENT_OTHER): Admitting: Radiology

## 2023-12-06 ENCOUNTER — Encounter (HOSPITAL_BASED_OUTPATIENT_CLINIC_OR_DEPARTMENT_OTHER): Payer: Self-pay

## 2023-12-06 VITALS — BP 113/77 | HR 85 | Temp 97.4°F | Resp 20

## 2023-12-06 DIAGNOSIS — R059 Cough, unspecified: Secondary | ICD-10-CM

## 2023-12-06 DIAGNOSIS — R053 Chronic cough: Secondary | ICD-10-CM

## 2023-12-06 DIAGNOSIS — R062 Wheezing: Secondary | ICD-10-CM | POA: Diagnosis not present

## 2023-12-06 NOTE — ED Provider Notes (Signed)
 [facility]    CSN: [id number] Arrival date & time: 12/06/23  1241      History   Chief Complaint Chief Complaint  Patient presents with   Cough    HPI ADELISE Smith is a 83 y.o. female.   Patient is an 82 year old female that presents today with cough .  Pts daughter states she has been having a chronic cough for around 1.5 years. She has been followed by her provider but was unable to be seen. Her daughter has noticed a difference in the cough.  The cough changes from time to time. States it sounds more croupy than before with slight wheezing. She is having some nasal congestion is worse  in the mornings. Pt denies fever, body aches, chest pain, SOB, or sore throat.    Cough   Past Medical History:  Diagnosis Date   Anxiety    Arthritis    COVID 03/2022   Dysrhythmia    years ago   )   Hypertension    PONV (postoperative nausea and vomiting) 2003 ??   [facility]. 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   Essential hypertension 09/05/2021   Hyperlipidemia 09/05/2021   Preoperative clearance 09/05/2021   Hyponatremia 08/02/2021   Left hip pain 09/08/2020   Lumbar spine pain 09/08/2020   Memory loss 11/30/2019   Chronic pain of left knee 02/04/2018   Anxiety 02/16/2016   GERD without esophagitis 02/16/2016   Age-related osteoporosis without current pathological fracture 02/15/2016   Chronic sinusitis 02/15/2016   Malaise and fatigue 02/15/2016   Primary insomnia 02/15/2016   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   ORIF TIBIA PLATEAU Left 10/10/2022   Procedure: OPEN REDUCTION INTERNAL FIXATION (ORIF) TIBIAL PLATEAU;  Surgeon: Kendal Franky SQUIBB, MD;  Location: [facility] OR;  Service: Orthopedics;  Laterality: Left;   PARTIAL MASTECTOMY WITH NEEDLE LOCALIZATION  04/29/2012   Procedure: PARTIAL MASTECTOMY WITH NEEDLE LOCALIZATION;  Surgeon: Debby A. Cornett, MD;  Location: [facility] OR;  Service: General;  Laterality: Left;  left breast partial mastectomy with needle localization   ROTATOR CUFF REPAIR     TONSILLECTOMY     as a child   TOTAL HIP ARTHROPLASTY Left 09/19/2021   Procedure: TOTAL HIP ARTHROPLASTY ANTERIOR APPROACH;  Surgeon: Ernie Cough, MD;  Location: [facility];  Service: Orthopedics;  Laterality: Left;    OB History   No obstetric history on file.      Home Medications    Prior to Admission medications   Medication Sig Start Date End Date Taking? Authorizing Provider  ALPRAZolam (XANAX) 0.5 MG tablet Take 0.5 mg by mouth 3 (three) times daily as needed for anxiety.    [provider]  cetirizine (ZYRTEC) 10 MG tablet Take 10 mg by mouth in the morning.    [provider]  D3 HIGH POTENCY 25 MCG (1000 UT) capsule Take 1,000 Units by mouth daily. 10/11/22   [provider]  donepezil (ARICEPT) 5 MG tablet Take 1 tablet (5 mg total) by mouth at bedtime. 10/28/23 10/22/24  Camara, Amadou, MD  escitalopram (LEXAPRO) 10 MG tablet Take 10 mg by mouth at bedtime. 08/02/21   [provider]  ibuprofen (ADVIL) 200 MG tablet Take 600 mg by mouth every 6 (six) hours as needed for mild pain or headache.    [provider]  magnesium gluconate (MAGONATE) 500 MG tablet Take 500 mg by mouth at bedtime.    [provider]  metoprolol succinate (TOPROL-XL) 50 MG 24 hr tablet Take 1.5 tablets (75 mg  total) by mouth daily. Take 1 and 1/2 tabs nightly following meal 03/29/23   Carlin Delon BROCKS, NP  Multiple Minerals-Vitamins (CAL MAG ZINC +D3) TABS Take 1 tablet by mouth daily. +K    [provider]  Nutritional Supplements (JUICE PLUS FIBRE PO) Take 2 capsules by mouth in the morning. Vegetable Blend (1) + Fruit Blend (1)    [provider]  rosuvastatin (CRESTOR) 5 MG tablet Take 5 mg by mouth at bedtime.    [provider]  vitamin B-12 (CYANOCOBALAMIN) 1000 MCG tablet Take 1,000 mcg by mouth in the morning.    [provider]  Wheat Dextrin (BENEFIBER DRINK MIX) PACK Take 1 Package by mouth daily. Additional of needed at bedtime Take with orange juice    [provider]    Family History Family History  Problem Relation Age of Onset   Alzheimer's disease Mother     Social History Social History   Tobacco Use   Smoking status: Never   Smokeless tobacco: Never  Vaping Use   Vaping status: Never Used  Substance Use Topics   Alcohol use: No   Drug use: No     Allergies   Codeine and Percocet [oxycodone-acetaminophen]   Review of Systems Review of Systems  Respiratory:  Positive for cough.      Physical Exam Triage Vital Signs ED Triage Vitals  Encounter Vitals Group     BP 12/06/23 1300 113/77     Girls Systolic BP Percentile --      Girls Diastolic BP Percentile --      Boys Systolic BP Percentile --      Boys Diastolic BP Percentile --      Pulse Rate 12/06/23 1300 85     Resp 12/06/23 1300 20     Temp 12/06/23 1300 (!) 97.4 F (36.3 C)     Temp Source 12/06/23 1300 Oral     SpO2 12/06/23 1300 97 %     Weight --      Height --      Head Circumference --      Peak Flow --      Pain Score 12/06/23 1259 0     Pain Loc --      Pain Education --      Exclude from Growth Chart --    No data found.  Updated Vital Signs BP 113/77 (BP Location: Right Arm)   Pulse 85   Temp (!) 97.4 F (36.3 C) (Oral)   Resp  20   SpO2 97%   Visual Acuity Right Eye Distance:   Left Eye Distance:   Bilateral Distance:    Right Eye Near:   Left Eye Near:    Bilateral Near:     Physical Exam  Constitutional:      General: She is not in acute distress.    Appearance: Normal appearance. She is not ill-appearing, toxic-appearing or diaphoretic.  HENT:     Head: Normocephalic and atraumatic.     Right Ear: Tympanic membrane and ear canal normal.     Left Ear: Tympanic membrane and ear canal normal.     Nose: Congestion present.     Mouth/Throat:     Pharynx: Oropharynx is clear.  Eyes:     Conjunctiva/sclera: Conjunctivae normal.  Cardiovascular:     Rate and Rhythm: Normal rate and regular rhythm.     Pulses: Normal pulses.     Heart sounds: Normal heart sounds.  Pulmonary:     Effort: Pulmonary effort is normal.     Breath sounds: Wheezing present.     Comments: Mild expiratory wheezing Left lower lung.  Skin:    General: Skin is warm and dry.  Neurological:     Mental Status: She is alert.  Psychiatric:        Mood and Affect: Mood normal.      UC Treatments / Results  Labs (all labs ordered are listed, but only abnormal results are displayed) Labs Reviewed - No data to display  EKG   Radiology DG Chest 2 View Result Date: 12/06/2023 CLINICAL DATA:  Cough, wheezing. EXAM: CHEST - 2 VIEW COMPARISON:  January 01, 2023. FINDINGS: The heart size and mediastinal contours are within normal limits. Both lungs are clear. The visualized skeletal structures are unremarkable. IMPRESSION: No active cardiopulmonary disease. Electronically Signed   By: Lynwood Landy Raddle M.D.   On: 12/06/2023 14:38    Procedures Procedures (including critical care time)  Medications Ordered in UC Medications - No data to display  Initial Impression / Assessment and Plan / UC Course  I have reviewed the triage vital signs and the nursing notes.  Pertinent labs & imaging results that were available during my  care of the patient were reviewed by me and considered in my medical decision making (see chart for details).     Cough- x ray normal. Did have some mild wheezing. She is otherwise feeling okay.  May be allergy related. Continue zyrtec daily.  Follow up with PCP as needed.  Final Clinical Impressions(s) / UC Diagnoses   Final diagnoses:  Chronic cough     Discharge Instructions      X ray normal.  Zyrtec daily  See PCP as needed.      ED Prescriptions   None    PDMP not reviewed this encounter.   Kristen Smith LABOR, FNP 12/06/23 1539

## 2023-12-06 NOTE — ED Triage Notes (Signed)
 Pt c/o cough changes in the last week. Pts daughter states she has been having a chronic cough for aroun 1.5 years. She has been followed by her provider but was unable to be seen. Her daughter has noticed a difference in the cough. States it sounds more croupy than before with slight wheezing. She is having some nasal congestion that are worst in the mornings. Pt denies fever, body aches, chest pain, SOBR, or sore throat.

## 2023-12-06 NOTE — Discharge Instructions (Addendum)
 X ray normal.  Zyrtec daily  See PCP as needed.

## 2023-12-25 ENCOUNTER — Encounter: Payer: Self-pay | Admitting: Neurology

## 2023-12-26 ENCOUNTER — Other Ambulatory Visit: Payer: Self-pay | Admitting: Neurology

## 2023-12-26 MED ORDER — DONEPEZIL HCL 10 MG PO TABS: 10.0000 mg | ORAL_TABLET | Freq: Every day | ORAL | 3 refills | Status: AC

## 2024-11-17 ENCOUNTER — Ambulatory Visit: Admitting: Neurology
